# Patient Record
Sex: Female | Born: 1935 | Race: White | Hispanic: No | Smoking: Never smoker
Health system: Southern US, Community
[De-identification: ages and names within clinical notes are randomized; demographics above are authoritative.]

## PROBLEM LIST (undated history)

## (undated) DIAGNOSIS — N189 Chronic kidney disease, unspecified: Secondary | ICD-10-CM

## (undated) DIAGNOSIS — G9341 Metabolic encephalopathy: Secondary | ICD-10-CM

## (undated) DIAGNOSIS — K219 Gastro-esophageal reflux disease without esophagitis: Secondary | ICD-10-CM

## (undated) DIAGNOSIS — I10 Essential (primary) hypertension: Secondary | ICD-10-CM

## (undated) DIAGNOSIS — D509 Iron deficiency anemia, unspecified: Secondary | ICD-10-CM

## (undated) DIAGNOSIS — F32A Depression, unspecified: Secondary | ICD-10-CM

## (undated) DIAGNOSIS — F0390 Unspecified dementia without behavioral disturbance: Secondary | ICD-10-CM

## (undated) HISTORY — PX: ABDOMINAL HYSTERECTOMY: SHX81

---

## 2004-05-08 ENCOUNTER — Ambulatory Visit: Payer: Self-pay | Admitting: Surgery

## 2004-11-27 ENCOUNTER — Ambulatory Visit: Payer: Self-pay | Admitting: Internal Medicine

## 2005-05-16 ENCOUNTER — Emergency Department: Payer: Self-pay | Admitting: Emergency Medicine

## 2005-05-29 ENCOUNTER — Ambulatory Visit: Payer: Self-pay | Admitting: Internal Medicine

## 2005-12-17 ENCOUNTER — Ambulatory Visit: Payer: Self-pay | Admitting: Internal Medicine

## 2006-09-04 ENCOUNTER — Ambulatory Visit: Payer: Self-pay | Admitting: Internal Medicine

## 2006-12-17 ENCOUNTER — Other Ambulatory Visit: Payer: Self-pay

## 2006-12-17 ENCOUNTER — Ambulatory Visit: Payer: Self-pay | Admitting: Ophthalmology

## 2006-12-19 ENCOUNTER — Ambulatory Visit: Payer: Self-pay | Admitting: Internal Medicine

## 2006-12-24 ENCOUNTER — Ambulatory Visit: Payer: Self-pay | Admitting: Ophthalmology

## 2007-02-03 ENCOUNTER — Ambulatory Visit: Payer: Self-pay | Admitting: Ophthalmology

## 2007-12-21 ENCOUNTER — Ambulatory Visit: Payer: Self-pay | Admitting: Internal Medicine

## 2008-12-21 ENCOUNTER — Ambulatory Visit: Payer: Self-pay | Admitting: Internal Medicine

## 2010-01-25 ENCOUNTER — Ambulatory Visit: Payer: Self-pay | Admitting: Internal Medicine

## 2011-01-28 ENCOUNTER — Ambulatory Visit: Payer: Self-pay | Admitting: Internal Medicine

## 2012-01-20 ENCOUNTER — Emergency Department: Payer: Self-pay | Admitting: Emergency Medicine

## 2012-05-15 ENCOUNTER — Ambulatory Visit: Payer: Self-pay | Admitting: Internal Medicine

## 2013-07-22 ENCOUNTER — Inpatient Hospital Stay: Payer: Self-pay | Admitting: Internal Medicine

## 2013-07-22 LAB — COMPREHENSIVE METABOLIC PANEL
ALBUMIN: 3.4 g/dL (ref 3.4–5.0)
ALK PHOS: 178 U/L — AB
AST: 24 U/L (ref 15–37)
Anion Gap: 5 — ABNORMAL LOW (ref 7–16)
BUN: 13 mg/dL (ref 7–18)
Bilirubin,Total: 0.3 mg/dL (ref 0.2–1.0)
CHLORIDE: 98 mmol/L (ref 98–107)
Calcium, Total: 9.2 mg/dL (ref 8.5–10.1)
Co2: 27 mmol/L (ref 21–32)
Creatinine: 0.96 mg/dL (ref 0.60–1.30)
EGFR (Non-African Amer.): 54 — ABNORMAL LOW
GLUCOSE: 145 mg/dL — AB (ref 65–99)
Osmolality: 263 (ref 275–301)
Potassium: 4.2 mmol/L (ref 3.5–5.1)
SGPT (ALT): 23 U/L (ref 12–78)
SODIUM: 130 mmol/L — AB (ref 136–145)
Total Protein: 7.9 g/dL (ref 6.4–8.2)

## 2013-07-22 LAB — URINALYSIS, COMPLETE
Bilirubin,UR: NEGATIVE
Glucose,UR: NEGATIVE mg/dL (ref 0–75)
Ketone: NEGATIVE
LEUKOCYTE ESTERASE: NEGATIVE
NITRITE: NEGATIVE
Ph: 6 (ref 4.5–8.0)
Protein: 30
RBC,UR: <1 /HPF (ref 0–5)
Specific Gravity: 1.003 (ref 1.003–1.030)

## 2013-07-22 LAB — CBC
HCT: 40.2 % (ref 35.0–47.0)
HGB: 12.8 g/dL (ref 12.0–16.0)
MCH: 25.4 pg — ABNORMAL LOW (ref 26.0–34.0)
MCHC: 31.9 g/dL — AB (ref 32.0–36.0)
MCV: 80 fL (ref 80–100)
Platelet: 444 10*3/uL — ABNORMAL HIGH (ref 150–440)
RBC: 5.05 10*6/uL (ref 3.80–5.20)
RDW: 18.6 % — AB (ref 11.5–14.5)
WBC: 10.8 10*3/uL (ref 3.6–11.0)

## 2013-07-22 LAB — PROTIME-INR
INR: 0.9
PROTHROMBIN TIME: 11.6 s (ref 11.5–14.7)

## 2013-07-22 LAB — LIPASE, BLOOD: Lipase: 145 U/L (ref 73–393)

## 2013-07-22 LAB — CLOSTRIDIUM DIFFICILE(ARMC)

## 2013-07-23 LAB — CBC WITH DIFFERENTIAL/PLATELET
Basophil #: 0 10*3/uL (ref 0.0–0.1)
Basophil %: 0.2 %
EOS ABS: 0 10*3/uL (ref 0.0–0.7)
Eosinophil %: 0.5 %
HCT: 32.5 % — ABNORMAL LOW (ref 35.0–47.0)
HGB: 10.6 g/dL — AB (ref 12.0–16.0)
Lymphocyte #: 1.3 10*3/uL (ref 1.0–3.6)
Lymphocyte %: 13.2 %
MCH: 26 pg (ref 26.0–34.0)
MCHC: 32.8 g/dL (ref 32.0–36.0)
MCV: 79 fL — AB (ref 80–100)
MONO ABS: 0.7 x10 3/mm (ref 0.2–0.9)
Monocyte %: 7 %
NEUTROS ABS: 7.7 10*3/uL — AB (ref 1.4–6.5)
NEUTROS PCT: 79.1 %
PLATELETS: 366 10*3/uL (ref 150–440)
RBC: 4.1 10*6/uL (ref 3.80–5.20)
RDW: 18 % — ABNORMAL HIGH (ref 11.5–14.5)
WBC: 9.8 10*3/uL (ref 3.6–11.0)

## 2013-07-23 LAB — BASIC METABOLIC PANEL
ANION GAP: 7 (ref 7–16)
BUN: 8 mg/dL (ref 7–18)
CALCIUM: 8.7 mg/dL (ref 8.5–10.1)
CHLORIDE: 102 mmol/L (ref 98–107)
Co2: 26 mmol/L (ref 21–32)
Creatinine: 0.88 mg/dL (ref 0.60–1.30)
EGFR (Non-African Amer.): 59 — ABNORMAL LOW
Glucose: 99 mg/dL (ref 65–99)
OSMOLALITY: 268 (ref 275–301)
Potassium: 3.8 mmol/L (ref 3.5–5.1)
SODIUM: 135 mmol/L — AB (ref 136–145)

## 2013-07-24 LAB — CBC WITH DIFFERENTIAL/PLATELET
Basophil #: 0.1 10*3/uL (ref 0.0–0.1)
Basophil %: 0.7 %
EOS PCT: 1.2 %
Eosinophil #: 0.1 10*3/uL (ref 0.0–0.7)
HCT: 33.8 % — ABNORMAL LOW (ref 35.0–47.0)
HGB: 10.9 g/dL — ABNORMAL LOW (ref 12.0–16.0)
LYMPHS PCT: 16.5 %
Lymphocyte #: 1.3 10*3/uL (ref 1.0–3.6)
MCH: 25.7 pg — AB (ref 26.0–34.0)
MCHC: 32.3 g/dL (ref 32.0–36.0)
MCV: 80 fL (ref 80–100)
MONOS PCT: 6.8 %
Monocyte #: 0.5 x10 3/mm (ref 0.2–0.9)
Neutrophil #: 5.9 10*3/uL (ref 1.4–6.5)
Neutrophil %: 74.8 %
Platelet: 378 10*3/uL (ref 150–440)
RBC: 4.24 10*6/uL (ref 3.80–5.20)
RDW: 18.7 % — AB (ref 11.5–14.5)
WBC: 7.8 10*3/uL (ref 3.6–11.0)

## 2013-07-27 LAB — STOOL CULTURE

## 2013-11-18 ENCOUNTER — Emergency Department: Payer: Self-pay | Admitting: Emergency Medicine

## 2013-11-18 LAB — COMPREHENSIVE METABOLIC PANEL
ALT: 24 U/L
Albumin: 3.6 g/dL (ref 3.4–5.0)
Alkaline Phosphatase: 132 U/L — ABNORMAL HIGH
Anion Gap: 13 (ref 7–16)
BUN: 24 mg/dL — ABNORMAL HIGH (ref 7–18)
Bilirubin,Total: 0.4 mg/dL (ref 0.2–1.0)
CALCIUM: 9.1 mg/dL (ref 8.5–10.1)
CHLORIDE: 96 mmol/L — AB (ref 98–107)
CO2: 23 mmol/L (ref 21–32)
Creatinine: 1.12 mg/dL (ref 0.60–1.30)
EGFR (Non-African Amer.): 44 — ABNORMAL LOW
GFR CALC AF AMER: 52 — AB
Glucose: 153 mg/dL — ABNORMAL HIGH (ref 65–99)
Osmolality: 272 (ref 275–301)
POTASSIUM: 4.7 mmol/L (ref 3.5–5.1)
SGOT(AST): 24 U/L (ref 15–37)
Sodium: 132 mmol/L — ABNORMAL LOW (ref 136–145)
TOTAL PROTEIN: 8 g/dL (ref 6.4–8.2)

## 2013-11-18 LAB — CBC WITH DIFFERENTIAL/PLATELET
BASOS PCT: 0.2 %
Basophil #: 0 10*3/uL (ref 0.0–0.1)
Eosinophil #: 0 10*3/uL (ref 0.0–0.7)
Eosinophil %: 0.2 %
HCT: 38.7 % (ref 35.0–47.0)
HGB: 12.3 g/dL (ref 12.0–16.0)
LYMPHS ABS: 1.7 10*3/uL (ref 1.0–3.6)
Lymphocyte %: 9.1 %
MCH: 27.4 pg (ref 26.0–34.0)
MCHC: 31.7 g/dL — ABNORMAL LOW (ref 32.0–36.0)
MCV: 87 fL (ref 80–100)
MONO ABS: 0.9 x10 3/mm (ref 0.2–0.9)
MONOS PCT: 4.9 %
NEUTROS ABS: 15.7 10*3/uL — AB (ref 1.4–6.5)
Neutrophil %: 85.6 %
PLATELETS: 489 10*3/uL — AB (ref 150–440)
RBC: 4.48 10*6/uL (ref 3.80–5.20)
RDW: 15.7 % — AB (ref 11.5–14.5)
WBC: 18.4 10*3/uL — AB (ref 3.6–11.0)

## 2013-11-18 LAB — LIPASE, BLOOD: Lipase: 199 U/L (ref 73–393)

## 2013-11-19 LAB — URINALYSIS, COMPLETE
BILIRUBIN, UR: NEGATIVE
Bacteria: NONE SEEN
Blood: NEGATIVE
GLUCOSE, UR: NEGATIVE mg/dL (ref 0–75)
Ketone: NEGATIVE
NITRITE: NEGATIVE
PH: 6 (ref 4.5–8.0)
RBC,UR: 2 /HPF (ref 0–5)
Specific Gravity: 1.008 (ref 1.003–1.030)
WBC UR: 11 /HPF (ref 0–5)

## 2014-07-30 NOTE — Discharge Summary (Signed)
PATIENT NAME:  Suzanne Beltran, [name] MR#:  [id number] DATE OF BIRTH:  April 10, 1924  DATE OF ADMISSION:  07/22/2013 DATE OF DISCHARGE:  07/24/2013  PRIMARY CARE PHYSICIAN:  Dr. Larwance SachsBabaoff.  FINAL DIAGNOSES: 1.  Bloody diarrhea, acute colitis, ischemic versus infectious.  2.  Anemia.  3.  Gastroesophageal reflux disease.  4.  Hyponatremia.  5.  Hypertension.   MEDICATIONS ON DISCHARGE:  Include atenolol 25 mg daily, lisinopril 20 mg 2 tablets daily, Zoloft 50 mg 1/2 tablet twice a day, amlodipine 10 mg daily, clorazepate 3.75 mg at bedtime as needed, Prilosec 20 mg daily, metronidazole 500 mg every eight hours for seven days, hydrocortisone 25 mg one suppository rectally twice a day as needed for hemorrhoids.  Cipro 500 mg 1 tablet every 12 hours for seven days.   DIET:  Bland diet.   ACTIVITY:  As tolerated.   DIET CONSISTENCY:  Regular.   REFERRAL:  To Dr. Mechele CollinElliott as needed, 1 to 2 weeks with Dr. Larwance SachsBabaoff.   HOSPITAL COURSE:  The patient was admitted 07/22/2013 and discharged 07/24/2013, came in with abdominal pain and diarrhea, believed to be acute colitis.  Stool studies were sent off.  The patient was started on empiric Cipro and Flagyl.  The patient was seen in consultation by Aura Dialsandace Jones,  physician's assistant.   LABORATORY AND RADIOLOGICAL DATA DURING THE HOSPITAL COURSE:  Included an EKG that showed sinus rhythm, premature atrial complexes.  Urinalysis 1+ blood.  INR 0.9.  Glucose 145, BUN 13, creatinine is 0.96, sodium 130, potassium 4.2, chloride 98, CO2 27, calcium 9.2, alkaline phosphatase slightly elevated at 178, ALT 23, AST 24, total protein 7.9.  White blood cell count 10.8, hemoglobin and hematocrit 12.8 and 40.2, platelet count of 444.  CT scan of the abdomen and pelvis showed findings consistent with inflammatory infectious colitis of the distal transverse and proximal descending colon.  No abscess or abnormal fluid collection is noted.  Large hiatal hernia, increased in size from  prior exam.  Stool for C. diff. was negative.  Stool culture:  No E. coli or Campylobacter.  Sodium upon discharge 135.  White blood cell count 7.8, hemoglobin 10.9, platelet count normal range.   HOSPITAL COURSE PER PROBLEM LIST:  1.  For the patient's bloody diarrhea, acute colitis, ischemic versus infectious.  The patient was feeling better.  Still did have some bowel movements, but forming up.  Some discomfort in the abdomen, but the patient was feeling much better than when she came in.  The patient was switched over to oral antibiotics and we will finish up a course, nothing grew on the stool studies.  2.  Anemia.  Hemoglobin stable.  3.  Gastroesophageal reflux disease.  Also found to have a hiatal hernia.  Continue Prilosec.  4.  Hyponatremia, improved with IV fluids.  5.  Hypertension.  Blood pressure stable on usual medications.  Time spent on discharge 35 minutes.    ____________________________ Herschell Dimesichard J. Renae GlossWieting, MD rjw:ea D: 07/24/2013 15:20:27 ET T: 07/25/2013 02:03:59 ET JOB#: [id number]  cc: Herschell Dimesichard J. Renae GlossWieting, MD, <Dictator> Kandyce RudMarcus Babaoff, MD Scot Junobert T. Elliott, MD  Salley ScarletICHARD J Madisyn Mawhinney MD ELECTRONICALLY SIGNED 07/25/2013 14:17

## 2014-07-30 NOTE — H&P (Signed)
PATIENT NAME:  DEA, BITTING MR#:  [id number] DATE OF BIRTH:  1924-09-02  DATE OF ADMISSION:  07/22/2013  PRIMARY CARE PROVIDER: Dr. Larwance Sachs.  EMERGENCY DEPARTMENT REFERRING DOCTOR: Dr. Margarita Grizzle.   CHIEF COMPLAINT: Abdominal pain, diarrhea.   HISTORY OF PRESENT ILLNESS: The patient is a pleasant 81 year old white female a previous history of hyperlipidemia, not on any medications, now, hypertension, GERD, depression, anxiety, history of diverticulosis, who states that she was in good health up until yesterday when she all of a sudden developed left lower quadrant. It was sharp in nature. Now it has become dull. She also started having diarrhea, which is described as a bloody diarrhea. Due to these symptoms, she comes to the ED. She reports that she has not had any sick contacts. Has not been on any antibiotics and has been feeling well. She denies any fevers, chills. No chest pain, shortness of breath. She does have chronic urinary urgency. She denies any episodes of low blood pressure that she can recall.   PAST MEDICAL HISTORY:  1. History of hyperlipidemia and was on medications now taken off.  2. Hypertension.  3. Gastroesophageal reflux disease.  4. Depression and anxiety.  5. History of diverticulosis.   PAST SURGICAL HISTORY: Status post hysterectomy.   ALLERGIES: SULFA.   CURRENT MEDICATIONS AT HOME: Amlodipine 10 daily, Atenolol 25 daily, clorazepate 3.75 mg 1 tab p.o. at bedtime, lisinopril 20,  2 tabs daily, Prilosec 20 daily, sertraline 25 mg 1 tab p.o. b.i.d.   SOCIAL HISTORY: Does not smoke. Does not drink. No drugs. Lives with her husband.   FAMILY HISTORY: There is a history of diverticulitis in a grandfather.   REVIEW OF SYSTEMS:  CONSTITUTIONAL: Denies any fevers, fatigue, weakness. Complains of abdominal pain. No weight loss or weight gain.  EYES: No blurred or double vision. No pain, redness or inflammation. No glaucoma. History of cataracts, status post surgery.   ENT: No tinnitus. No ear pain. No hearing loss. No seasonal or year-round allergies. No epistaxis. No nasal discharge. No difficulty swallowing.  RESPIRATORY: Denies any cough, wheezing, hemoptysis. No COPD, no tuberculosis.  CARDIOVASCULAR: Denies any chest pain, orthopnea, edema or arrhythmia.  GASTROINTESTINAL: No nausea, vomiting. Complains of diarrhea complains of abdominal pain. No hematemesis. No melena. No ulcer. (Dictation Anomaly) . No irritable bowel syndrome. No jaundice. Does complain of bloody stools.  GENITOURINARY: Denies any dysuria, hematuria, renal calculus. Does complain of urinary urgency. ENDOCRINE: Denies any polyuria, nocturia or thyroid problems.  HEMATOLOGIC AND LYMPHATIC: Denies anemia, easy bruisability or bleeding.  SKIN: No acne. No rash. No changes in hair, mole or skin.  MUSCULOSKELETAL: No pain in the neck, back or shoulder.  NEUROLOGIC: No numbness, CVA, transient ischemic attack or seizures.  PSYCHIATRIC: Does have anxiety and depression.   PHYSICAL EXAMINATION: VITAL SIGNS: Temperature 98.1, pulse 93, respirations 18, blood pressure 125/72, O2 of 98%.  GENERAL: The patient is a well-developed, well-nourished female in no acute distress.  HEENT: Head atraumatic, normocephalic. Pupils equally round, reactive to light and accommodation. There is no conjunctival pallor. No scleral icterus. Nasal exam shows no drainage or ulceration.  OROPHARYNX: Clear without any exudate.  NECK: Supple without any JVD.  CARDIOVASCULAR: Regular rate and rhythm. No murmurs, rubs, clicks, or gallops.  LUNGS: Clear to auscultation bilaterally without any rales, rhonchi, wheezing.  ABDOMEN: Soft, nontender, nondistended. Positive bowel sounds x 4.  EXTREMITIES: No clubbing, cyanosis, or edema.  SKIN: No rash.  LYMPHATICS: No lymph nodes palpable.  VASCULAR: Good DP,  PT pulses.   PSYCHIATRIC: Not anxious or depressed. NEUROLOGICAL: Alert, oriented x 3. No focal deficits.   LYMPHATICS: No lymph nodes palpable.   LABORATORY DATA: Glucose 145, BUN 13, creatinine 0.96, sodium 130, potassium 4.2, chloride 98, CO2 is 27, calcium 9.2, lipase 145. LFTs were normal, except alkaline phosphatase of 178. WBC 10.8, hemoglobin 12.8, platelet count 444, INR 0.9. Urinalysis is negative. CT scan of the abdomen with contrast shows large sliding hiatal hernia. Stable bilateral renal cyst. Findings consistent with inflammatory infectious colitis of the distal transverse and proximal descending colon.   ASSESSMENT AND PLAN: The patient is an 81 year old white female with hypertension gastroesophageal reflux disease, depression, anxiety, who presents with abdominal pain, diarrhea.  1. Diarrhea. Abdominal pain due to acute colitis: Possible differential diagnosis could include ischemic infectious colitis. At this time, we will give her IV fluids. We will check  comprehensive stool studies. Rule out C. difficile. She will be on IV antibiotics with Cipro and p.o. Flagyl. I will ask gastroenterology to evaluate the patient. We will  follow her hemoglobin and transfuse as needed.  2. Hypertension. We will continue atenolol, amlodipine and lisinopril.   3. Gastroesophageal reflux disease. She was on Prilosec. We will place her on Protonix.  4. Hyponatremia likely due to dehydration. We will give her IV fluids.  5. Miscellaneous. She will be on sequential compression devices for deep vein thrombosis prophylaxis.  TIME SPENT ON THIS PATIENT: Is 50 minutes.    ____________________________ Lacie ScottsShreyang H. Allena KatzPatel, MD shp:sg D: 07/22/2013 13:03:17 ET T: 07/22/2013 13:15:58 ET JOB#: [id number]  cc: Kayleah Appleyard H. Allena KatzPatel, MD, <Dictator> Charise CarwinSHREYANG H Shawnita Krizek MD ELECTRONICALLY SIGNED 07/23/2013 13:48

## 2014-07-30 NOTE — Consult Note (Signed)
PATIENT NAME:  Suzanne Beltran, Suzanne Beltran MR#:  [id number] DATE OF BIRTH:  09-30-1924  DATE OF CONSULTATION:  07/23/2013  REFERRING PHYSICIAN:  Dr. Posey Pronto CONSULTING PHYSICIAN:  Lucilla Lame, MD / Andria Meuse, NP  PRIMARY CARE PHYSICIAN: Dr. Baldemar Lenis  REASON FOR CONSULTATION: Acute colitis.   HISTORY OF PRESENT ILLNESS: Ms. Scinto is a very pleasant 81 year old Caucasian female who developed left lower quadrant abdominal pain 2 days ago. She tells me she had just finished vacuuming and doing the wash and she sat down to eat some french toast around 10:00 in the morning. She describes the pain as a hard pain that radiated to her back. She does have a history of low back pain and left hip pain. She did have a little diarrhea that night and awakened with a bowel movement and small amount of blood when she wiped with the tissue the next morning. She has a history hemorrhoids and so she does notice intermittent hematochezia with wiping at times, especially when she has constipation. Episode yesterday morning seemed to be a little more volume of bleeding. She takes stool softeners as needed. She denies any nausea, vomiting, fever or chills. Her abdominal pain has resolved today. Her weight has been stable over the last 6 months. She has had some anorexia with some stress in her life, but overall has been doing well. She had a C. diff which was negative. Her hemoglobin dropped from 12.8 to 10.6. She is not taking any NSAIDs. She had a colonoscopy October of 2004 by Dr. Vira Agar, which showed inflammation of the distal transverse and proximal descending colon. She also had mild sigmoid diverticulosis without inflammation. CT also showed stable bilateral renal cysts and a large hiatal hernia that has increased in size since last imaging. She denies any NSAIDs.   PAST MEDICAL AND SURGICAL HISTORY: She has an essential tremor, hyperlipidemia, GERD, diverticulosis, history of adenomatous polyps with last colonoscopy in  2004, cataracts, diverticulitis many years ago, hysterectomy followed by bilateral salpingo-oophorectomy.  MEDICATIONS PRIOR TO ADMISSION: Amlodipine 10 mg daily, atenolol 25 mg daily, clorazepate 3.75 mg daily, lisinopril 20 mg 2 tablets daily, Prilosec 20 mg daily, sertraline 50 mg 1/2 tablet b.i.d., Tylenol p.r.n.    ALLERGIES: SULFA CAUSES HIVES.  FAMILY HISTORY: Positive for father with diverticulitis. She had 1 daughter with breast cancer. There is no known family history of colorectal carcinoma or liver problems.   SOCIAL HISTORY: She lives with her husband. She has 2 grown healthy children. She denies any tobacco, alcohol or illicit drug use. She is retired from a [employer].   REVIEW OF SYSTEMS: Musculoskeletal: She has chronic left hip and left low back pain for which she takes Tylenol. Otherwise negative 12 point review of systems.  PHYSICAL EXAMINATION: VITAL SIGNS: Temp 98.4, pulse 67, respirations 18, blood pressure 137/66, O2 sat 95% on room air.  GENERAL: She is a well-developed, well-nourished elderly Caucasian female in no acute distress.  HEENT: Sclerae clear, anicteric. Conjunctivae pink. Oropharynx pink and moist without any lesions.  NECK: Supple without mass or thyromegaly.  CHEST: Heart regular rate and rhythm. Normal S1, S2. No murmurs, clicks, rubs, or gallops.  LUNGS: Clear to auscultation bilaterally.  ABDOMEN: Positive bowel sounds x4. No bruits auscultated. She has mild left lower quadrant tenderness on deep palpation. There is no rebound tenderness or guarding. No hepatosplenomegaly or mass.  RECTAL: Deferred.  EXTREMITIES: Without clubbing or edema.  SKIN: Pink, warm and dry without rash or jaundice.  NEUROLOGIC: Grossly  intact.  MUSCULOSKELETAL: Good equal movement and strength bilaterally. She does have a right arm essential tremor.  PSYCHIATRIC: Normal mood and affect. Alert and cooperative.   LABORATORY STUDIES: Lipase 145. White blood cell count 9.8,  platelets 366,000. INR 0.9. Sodium 135. Otherwise normal MET-7.   IMPRESSION: Ms. Franze is a very pleasant 81 year old Caucasian female with acute colitis and hematochezia. Her last colonoscopy was 11 years ago, but she does not want to pursue endoscopic work-up for this given her age and she understands that inflammatory bowel disease or malignancy may be missed; however, I feel this is reasonable as her symptoms are suggestive of acute infectious colitis, ischemic colitis and cannot rule out diverticulitis either. Her Clostridium difficile was negative.   PLAN: 1.  Cipro and Flagyl was changed to p.o. and she should complete a 10 day course to cover for diverticulitis or any infectious diarrhea. 2.  Follow up on her stool culture.  3.  Anusol suppositories for her known hemorrhoids, 25 mg b.i.d.  4.  Continue supportive measures.   Thank you for allowing [place] to participate in her care.  This services provided by Andria Meuse, NP under collaborative agreement with Dr. Lucilla Lame. ____________________________ Andria Meuse, NP klj:sb D: 07/23/2013 15:56:12 ET T: 07/23/2013 16:28:34 ET JOB#: [id number]  cc: Andria Meuse, NP, <Dictator> Derinda Late, MD Andria Meuse FNP ELECTRONICALLY SIGNED 07/27/2013 16:34

## 2014-07-30 NOTE — Consult Note (Signed)
Brief Consult Note: Diagnosis: Acute colitis.   Patient was seen by consultant.   Comments: Ms. Suzanne Beltran is a very pleasant 81 y/o caucasian female with acute colitis & hematochezia.  Last colonoscopy 11 years ago but patient does not want to pursue endoscopic work-up for this given her age & she understands that inflammatory bowel disease or malignancy may be missed however I feel this is reasonable as her symptoms are suggestive of acute infectious colitis, ischemia or diverticulitis.  C diff negative.    Plan: 1) Cipro & flagyl changed to PO & she should complete 10day course 2) follow up stool culture 3) Anusol supp for known hemorrhoids BID 4) continue supportive measures Thanks for allowing us to participate in her care.  Please see full dictated note. #[id number] Dr Marva PandaSkulskie will follow over the weekend..  Electronic Signatures: Joselyn ArrowJones, Wanna Gully L (NP)  (Signed 17-Apr-15 15:57)  Authored: Brief Consult Note   Last Updated: 17-Apr-15 15:57 by Joselyn ArrowJones, Baileigh Modisette L (NP)

## 2017-10-07 ENCOUNTER — Emergency Department: Payer: Medicare Other

## 2017-10-07 ENCOUNTER — Encounter: Payer: Self-pay | Admitting: Emergency Medicine

## 2017-10-07 ENCOUNTER — Emergency Department
Admission: EM | Admit: 2017-10-07 | Discharge: 2017-10-07 | Disposition: A | Discharge status: Home / Self Care | Payer: Medicare Other | Attending: Emergency Medicine | Admitting: Emergency Medicine

## 2017-10-07 ENCOUNTER — Other Ambulatory Visit: Payer: Self-pay

## 2017-10-07 DIAGNOSIS — I10 Essential (primary) hypertension: Secondary | ICD-10-CM | POA: Insufficient documentation

## 2017-10-07 DIAGNOSIS — R109 Unspecified abdominal pain: Secondary | ICD-10-CM | POA: Diagnosis present

## 2017-10-07 HISTORY — DX: Essential (primary) hypertension: I10

## 2017-10-07 LAB — CBC
HEMATOCRIT: 40.6 % (ref 35.0–47.0)
Hemoglobin: 13.9 g/dL (ref 12.0–16.0)
MCH: 30.6 pg (ref 26.0–34.0)
MCHC: 34.2 g/dL (ref 32.0–36.0)
MCV: 89.5 fL (ref 80.0–100.0)
Platelets: 460 10*3/uL — ABNORMAL HIGH (ref 150–440)
RBC: 4.53 MIL/uL (ref 3.80–5.20)
RDW: 14.1 % (ref 11.5–14.5)
WBC: 8 10*3/uL (ref 3.6–11.0)

## 2017-10-07 LAB — LIPASE, BLOOD: LIPASE: 54 U/L — AB (ref 11–51)

## 2017-10-07 LAB — URINALYSIS, COMPLETE (UACMP) WITH MICROSCOPIC
BACTERIA UA: NONE SEEN
BILIRUBIN URINE: NEGATIVE
GLUCOSE, UA: NEGATIVE mg/dL
Hgb urine dipstick: NEGATIVE
KETONES UR: NEGATIVE mg/dL
Leukocytes, UA: NEGATIVE
NITRITE: NEGATIVE
PH: 6 (ref 5.0–8.0)
PROTEIN: 100 mg/dL — AB
Specific Gravity, Urine: 1.008 (ref 1.005–1.030)

## 2017-10-07 LAB — COMPREHENSIVE METABOLIC PANEL
ALT: 17 U/L (ref 0–44)
ANION GAP: 12 (ref 5–15)
AST: 34 U/L (ref 15–41)
Albumin: 4.9 g/dL (ref 3.5–5.0)
Alkaline Phosphatase: 119 U/L (ref 38–126)
BUN: 20 mg/dL (ref 8–23)
CHLORIDE: 92 mmol/L — AB (ref 98–111)
CO2: 22 mmol/L (ref 22–32)
Calcium: 9.5 mg/dL (ref 8.9–10.3)
Creatinine, Ser: 0.97 mg/dL (ref 0.44–1.00)
GFR calc Af Amer: 58 mL/min — ABNORMAL LOW (ref >60)
GFR calc non Af Amer: 50 mL/min — ABNORMAL LOW (ref >60)
GLUCOSE: 114 mg/dL — AB (ref 70–99)
POTASSIUM: 4.1 mmol/L (ref 3.5–5.1)
SODIUM: 126 mmol/L — AB (ref 135–145)
Total Bilirubin: 0.7 mg/dL (ref 0.3–1.2)
Total Protein: 9.1 g/dL — ABNORMAL HIGH (ref 6.5–8.1)

## 2017-10-07 NOTE — ED Triage Notes (Signed)
Pt to ED from home c/o left lower back pain radiating to left hip and left abd pain.  States moved furniture 2-3 weeks ago and pain has been since then.  States burning with urination.  Nausea with vomiting, denies diarrhea or fevers.

## 2017-10-07 NOTE — Discharge Instructions (Addendum)
As we discussed your work-up today has shown largely normal results besides a low sodium level.  Please increase your salt intake over the next several weeks.  Please follow-up with your doctor for recheck of your salt levels in the next 3 to 4 days.  Return to the emergency department for any worsening pain, development of fever, or any other symptom personally concerning to yourself.

## 2017-10-07 NOTE — ED Notes (Signed)
Patient transported to CT 

## 2017-10-07 NOTE — ED Provider Notes (Signed)
[facility] Emergency Department Provider Note  Time seen: 5:46 PM  I have reviewed the triage vital signs and the nursing notes.   HISTORY  Chief Complaint Abdominal Pain and Back Pain    HPI Suzanne Beltran is a 84 y.o. female with a past medical history of hypertension who presents to the emergency department for left flank pain.  According to the patient for the past 1 week she is intermittently experiencing left flank pain.  States she has been moving furniture, is not sure if this is musculoskeletal.  She states 1 month ago she thought she had a urinary tract infection but was checked by her primary care doctor and told that she did not.  Denies any fever, nausea, vomiting or diarrhea.  Describes the pain as mild, denies any worsening with movement.   Past Medical History:  Diagnosis Date  . Hypertension     There are no active problems to display for this patient.   Past Surgical History:  Procedure Laterality Date  . ABDOMINAL HYSTERECTOMY      Prior to Admission medications   Not on File    No Known Allergies  History reviewed. No pertinent family history.  Social History Social History   Tobacco Use  . Smoking status: Never Smoker  . Smokeless tobacco: Never Used  Substance Use Topics  . Alcohol use: Never    Frequency: Never  . Drug use: Never    Review of Systems Constitutional: Negative for fever. Eyes: Negative for visual complaints ENT: Negative for recent illness/congestion Cardiovascular: Negative for chest pain. Respiratory: Negative for shortness of breath. Gastrointestinal: Mild left lower quadrant abdominal pain and left back pain.  Negative for nausea vomiting diarrhea Genitourinary: Negative for urinary compaints Musculoskeletal: Negative for musculoskeletal complaints Skin: Negative for skin complaints  Neurological: Negative for headache All other ROS  negative  ____________________________________________   PHYSICAL EXAM:  VITAL SIGNS: ED Triage Vitals  Enc Vitals Group     BP 10/07/17 1656 (!) 205/80     Pulse Rate 10/07/17 1656 74     Resp 10/07/17 1656 20     Temp 10/07/17 1656 98.1 F (36.7 C)     Temp Source 10/07/17 1656 Oral     SpO2 10/07/17 1656 98 %     Weight 10/07/17 1656 106 lb (48.1 kg)     Height 10/07/17 1656 4\' 11"  (1.499 m)     Head Circumference --      Peak Flow --      Pain Score 10/07/17 1704 10     Pain Loc --      Pain Edu? --      Excl. in GC? --     Constitutional: Alert and oriented. Well appearing and in no distress. Eyes: Normal exam ENT   Head: Normocephalic and atraumatic.   Mouth/Throat: Mucous membranes are moist. Cardiovascular: Normal rate, regular rhythm.  Respiratory: Normal respiratory effort without tachypnea nor retractions. Breath sounds are clear  Gastrointestinal: Soft, slight left lower quadrant tenderness, mild left paraspinal tenderness to palpation, no CVA tenderness.  No abdominal distention, rebound or guarding. Musculoskeletal: Nontender with normal range of motion in all extremities. Neurologic:  Normal speech and language. No gross focal neurologic deficits Skin:  Skin is warm, dry and intact.  Psychiatric: Mood and affect are normal.   ____________________________________________   RADIOLOGY  CT negative for acute abnormality.  ____________________________________________   INITIAL IMPRESSION / ASSESSMENT AND PLAN / ED COURSE  Pertinent labs &  imaging results that were available during my care of the patient were reviewed by me and considered in my medical decision making (see chart for details).  She presents emergency department for left flank pain intermittent over the past 1 week.  Differential would include ureterolithiasis, pyelonephritis, urinary tract infection, muscular skeletal pain, diverticulitis or colitis.  Patient's labs show  hyponatremia with a sodium of 126.  In reviewing the patient's records she has hyponatremia at baseline but usually between 130-133, slightly lower today.  Urinalysis is negative.  Mild lipase elevation but no epigastric tenderness.  We will obtain a CT scan of the abdomen to further evaluate.  Patient agreeable to plan of care.  CT scans negative for acute abnormality.  Labs are largely within normal limits.  I discussed these results with the patient and family, they are reassured, we will discharge the patient home with instructions to increase her sodium intake and follow-up with her doctor later this week for recheck of her sodium level.  Patient agreeable to this plan of care. ____________________________________________   FINAL CLINICAL IMPRESSION(S) / ED DIAGNOSES  Flank pain    Minna Antis, MD 10/07/17 1907

## 2020-08-23 ENCOUNTER — Emergency Department: Payer: Medicare Other

## 2020-08-23 ENCOUNTER — Emergency Department
Admission: EM | Admit: 2020-08-23 | Discharge: 2020-08-23 | Disposition: A | Discharge status: Home / Self Care | Payer: Medicare Other | Attending: Emergency Medicine | Admitting: Emergency Medicine

## 2020-08-23 ENCOUNTER — Other Ambulatory Visit: Payer: Self-pay

## 2020-08-23 DIAGNOSIS — S79912A Unspecified injury of left hip, initial encounter: Secondary | ICD-10-CM | POA: Diagnosis present

## 2020-08-23 DIAGNOSIS — S72115A Nondisplaced fracture of greater trochanter of left femur, initial encounter for closed fracture: Secondary | ICD-10-CM | POA: Diagnosis not present

## 2020-08-23 DIAGNOSIS — W01198A Fall on same level from slipping, tripping and stumbling with subsequent striking against other object, initial encounter: Secondary | ICD-10-CM | POA: Insufficient documentation

## 2020-08-23 DIAGNOSIS — I10 Essential (primary) hypertension: Secondary | ICD-10-CM | POA: Insufficient documentation

## 2020-08-23 DIAGNOSIS — Z79899 Other long term (current) drug therapy: Secondary | ICD-10-CM | POA: Insufficient documentation

## 2020-08-23 NOTE — ED Notes (Signed)
Proivded DC instructions. Verbalized understanding.

## 2020-08-23 NOTE — Discharge Instructions (Signed)
Use your walker any time you are up and moving around.  Schedule an appointment with orthopedics for repeat imaging.  Rest, ice off and on throughout the day, take Tylenol every 8 hours.  Return to the ER for symptoms that change or worsen if unable to see primary care or orthopedics.

## 2020-08-23 NOTE — ED Triage Notes (Signed)
Pt comes with c/o trip and fall outside today. Pt states she tripped over her outside furniture. Pt denies any LOC. Pt states pain to left hip area. Pt states she can ambulate on it but it is painful.

## 2020-08-23 NOTE — ED Provider Notes (Signed)
[facility] Emergency Department Provider Note ____________________________________________  Time seen: Approximately 3:50 PM  I have reviewed the triage vital signs and the nursing notes.   HISTORY  Chief Complaint Fall    HPI Suzanne Beltran is a 87 y.o. female who presents to the emergency department for evaluation and treatment of left hip pain. She states that her son-in-law brought her some lunch and she tripped on one of her patio chairs and fell onto her hip. She did not hit her head. She is not on blood thinners. No other injury. Pain in hip increases with knee flexion and ambulation. No alleviating measures prior to arrival.  Past Medical History:  Diagnosis Date  . Hypertension     There are no problems to display for this patient.   Past Surgical History:  Procedure Laterality Date  . ABDOMINAL HYSTERECTOMY      Prior to Admission medications   Medication Sig Start Date End Date Taking? Authorizing Provider  acetaminophen (TYLENOL) 500 MG tablet Take 500 mg by mouth as needed for mild pain or moderate pain.     [provider]  amLODipine (NORVASC) 10 MG tablet Take 10 mg by mouth daily.    [provider]  atenolol (TENORMIN) 25 MG tablet Take 25 mg by mouth daily.    [provider]  cetirizine (ZYRTEC) 10 MG tablet Take 10 mg by mouth daily as needed for allergies.     [provider]  ferrous sulfate 325 (65 FE) MG tablet Take 325 mg by mouth daily with breakfast.    [provider]  lisinopril (PRINIVIL,ZESTRIL) 40 MG tablet Take 40 mg by mouth daily.    [provider]  meloxicam (MOBIC) 7.5 MG tablet Take 3.75 mg by mouth daily.    [provider]  mirtazapine (REMERON) 15 MG tablet Take 7.5 mg by mouth at bedtime.    [provider]  Multiple Vitamins-Minerals (ICAPS PO) Take 1 capsule by mouth daily.    [provider]  omeprazole (PRILOSEC) 20 MG  capsule Take 20 mg by mouth daily as needed (heartburn).     [provider]  sertraline (ZOLOFT) 50 MG tablet Take 25 mg by mouth daily.    [provider]    Allergies Patient has no known allergies.  No family history on file.  Social History Social History   Tobacco Use  . Smoking status: Never Smoker  . Smokeless tobacco: Never Used  Substance Use Topics  . Alcohol use: Never  . Drug use: Never    Review of Systems Constitutional: Negative for fever. Cardiovascular: Negative for chest pain. Respiratory: Negative for shortness of breath. Musculoskeletal: Positive for left hip pain. Skin: Negative for open wound.  Neurological: Negative for decrease in sensation  ____________________________________________   PHYSICAL EXAM:  VITAL SIGNS: ED Triage Vitals  Enc Vitals Group     BP 08/23/20 1400 (!) 174/88     Pulse Rate 08/23/20 1400 90     Resp 08/23/20 1400 20     Temp 08/23/20 1400 98.7 F (37.1 C)     Temp Source 08/23/20 1400 Oral     SpO2 08/23/20 1400 98 %     Weight 08/23/20 1400 92 lb (41.7 kg)     Height 08/23/20 1400 5' (1.524 m)     Head Circumference --      Peak Flow --      Pain Score 08/23/20 1405 10     Pain  Loc --      Pain Edu? --      Excl. in GC? --     Constitutional: Alert and oriented. Well appearing and in no acute distress. Eyes: Conjunctivae are clear without discharge or drainage Head: Atraumatic Neck: Supple. No focal midline tenderness. Respiratory: No cough. Respirations are even and unlabored. Musculoskeletal: lateral hip pain without leg shortening or rotation. Neurologic: Awake, alert, oriented x 4.   Skin: No open wounds overlying the left hip.  Psychiatric: Affect and behavior are appropriate.  ____________________________________________   LABS (all labs ordered are listed, but only abnormal results are displayed)  Labs Reviewed - No data to  display ____________________________________________  RADIOLOGY  X-ray of the left hip negative for acute concerns.  I, Kem Boroughs, personally viewed and evaluated these images (plain radiographs) as part of my medical decision making, as well as reviewing the written report by the radiologist.  CT Hip Left Wo Contrast  Result Date: 08/23/2020 CLINICAL DATA:  Hip pain after tripping and falling. EXAM: CT OF THE LEFT HIP WITHOUT CONTRAST TECHNIQUE: Multidetector CT imaging of the left hip was performed according to the standard protocol. Multiplanar CT image reconstructions were also generated. COMPARISON:  Radiographs same date.  Pelvic CT 10/07/2017. FINDINGS: Bones/Joint/Cartilage There is an acute mildly displaced fracture of the left greater trochanter. This fracture demonstrates no definite intertrochanteric extension. There is underlying advanced left hip osteoarthritis with joint space narrowing, osteophytes and subchondral cyst formation in the femoral head and acetabulum. No significant hip joint effusion. There are old pubic rami fractures bilaterally. Ligaments Suboptimally assessed by CT. Muscles and Tendons Unremarkable. Soft tissues Mild soft tissue swelling around the greater trochanter without focal hematoma. No foreign body or soft tissue emphysema. Mild iliofemoral atherosclerosis. Diverticular changes are noted within the visualized sigmoid colon. IMPRESSION: 1. Acute mildly displaced fracture of the left femoral greater trochanter. This fracture demonstrates no definite intertrochanteric extension. Subtle intertrochanteric extension can be occult by CT, and if the patient has inability to bear weight, radiographic or MR follow-up should be considered. 2. Old pubic rami fractures bilaterally. 3. Moderately advanced left hip osteoarthritis. Electronically Signed   By: Carey Bullocks M.D.   On: 08/23/2020 16:16   DG Hip Unilat With Pelvis 2-3 Views Left  Result Date:  08/23/2020 CLINICAL DATA:  Acute left hip pain after fall 3 days ago. EXAM: DG HIP (WITH OR WITHOUT PELVIS) 2-3V LEFT COMPARISON:  None. FINDINGS: There is no evidence of hip fracture or dislocation. Moderate osteophyte formation and narrowing of the left hip joint is noted. IMPRESSION: Moderate osteoarthritis of the left hip.  No acute abnormality seen. Electronically Signed   By: Lupita Raider M.D.   On: 08/23/2020 14:54   ____________________________________________   PROCEDURES  Procedures  ____________________________________________   INITIAL IMPRESSION / ASSESSMENT AND PLAN / ED COURSE  Suzanne Beltran is a 87 y.o. who presents to the emergency department for treatment and evaluation of left hip pain. See HPI.  She states that she is still able to bear weight and walk unassisted, however has pain in her hip.  X-ray is negative. Pain increases with flexion while non-weightbearing. Plan will be to do CT to ensure absence of fracture. Patient aware and agreeable with the plan.  CT shows acute mildly displaced fracture of the left femoral greater trochanter with out intertrochanteric extension.  Patient will be discharged home to follow-up with orthopedics.  She was strongly encouraged to use a walker anytime she is  out of bed.  Medications - No data to display  Pertinent labs & imaging results that were available during my care of the patient were reviewed by me and considered in my medical decision making (see chart for details).   _________________________________________   FINAL CLINICAL IMPRESSION(S) / ED DIAGNOSES  Final diagnoses:  Closed nondisplaced fracture of greater trochanter of left femur, initial encounter [facility])    ED Discharge Orders    None       If controlled substance prescribed during this visit, 12 month history viewed on the [facility] prior to issuing an initial prescription for Schedule II or III opiod.   Chinita Pester, FNP 08/23/20 2120     Shaune Pollack, MD 08/29/20 2056

## 2021-05-19 ENCOUNTER — Emergency Department: Payer: Medicare Other

## 2021-05-19 ENCOUNTER — Inpatient Hospital Stay
Admission: EM | Admit: 2021-05-19 | Discharge: 2021-05-25 | DRG: 305 | Disposition: A | Discharge status: Skilled Nursing Facility | Payer: Medicare Other | Attending: Student in an Organized Health Care Education/Training Program | Admitting: Student in an Organized Health Care Education/Training Program

## 2021-05-19 ENCOUNTER — Other Ambulatory Visit: Payer: Self-pay

## 2021-05-19 DIAGNOSIS — F03918 Unspecified dementia, unspecified severity, with other behavioral disturbance: Secondary | ICD-10-CM | POA: Diagnosis present

## 2021-05-19 DIAGNOSIS — R4182 Altered mental status, unspecified: Secondary | ICD-10-CM | POA: Diagnosis not present

## 2021-05-19 DIAGNOSIS — F32A Depression, unspecified: Secondary | ICD-10-CM | POA: Diagnosis present

## 2021-05-19 DIAGNOSIS — D509 Iron deficiency anemia, unspecified: Secondary | ICD-10-CM | POA: Diagnosis present

## 2021-05-19 DIAGNOSIS — H3530 Unspecified macular degeneration: Secondary | ICD-10-CM | POA: Diagnosis present

## 2021-05-19 DIAGNOSIS — I10 Essential (primary) hypertension: Secondary | ICD-10-CM | POA: Diagnosis present

## 2021-05-19 DIAGNOSIS — F0393 Unspecified dementia, unspecified severity, with mood disturbance: Secondary | ICD-10-CM | POA: Diagnosis present

## 2021-05-19 DIAGNOSIS — I160 Hypertensive urgency: Principal | ICD-10-CM | POA: Diagnosis present

## 2021-05-19 DIAGNOSIS — N1831 Chronic kidney disease, stage 3a: Secondary | ICD-10-CM | POA: Diagnosis not present

## 2021-05-19 DIAGNOSIS — Z9183 Wandering in diseases classified elsewhere: Secondary | ICD-10-CM

## 2021-05-19 DIAGNOSIS — F0390 Unspecified dementia without behavioral disturbance: Secondary | ICD-10-CM | POA: Diagnosis present

## 2021-05-19 DIAGNOSIS — Z20822 Contact with and (suspected) exposure to covid-19: Secondary | ICD-10-CM | POA: Diagnosis present

## 2021-05-19 DIAGNOSIS — G9341 Metabolic encephalopathy: Secondary | ICD-10-CM | POA: Diagnosis not present

## 2021-05-19 DIAGNOSIS — Z79899 Other long term (current) drug therapy: Secondary | ICD-10-CM

## 2021-05-19 DIAGNOSIS — Z111 Encounter for screening for respiratory tuberculosis: Secondary | ICD-10-CM

## 2021-05-19 DIAGNOSIS — R413 Other amnesia: Secondary | ICD-10-CM | POA: Diagnosis not present

## 2021-05-19 DIAGNOSIS — K219 Gastro-esophageal reflux disease without esophagitis: Secondary | ICD-10-CM | POA: Diagnosis not present

## 2021-05-19 DIAGNOSIS — E785 Hyperlipidemia, unspecified: Secondary | ICD-10-CM | POA: Diagnosis present

## 2021-05-19 DIAGNOSIS — N179 Acute kidney failure, unspecified: Secondary | ICD-10-CM | POA: Diagnosis not present

## 2021-05-19 DIAGNOSIS — Z791 Long term (current) use of non-steroidal anti-inflammatories (NSAID): Secondary | ICD-10-CM

## 2021-05-19 DIAGNOSIS — I129 Hypertensive chronic kidney disease with stage 1 through stage 4 chronic kidney disease, or unspecified chronic kidney disease: Secondary | ICD-10-CM | POA: Diagnosis present

## 2021-05-19 DIAGNOSIS — I674 Hypertensive encephalopathy: Secondary | ICD-10-CM

## 2021-05-19 HISTORY — DX: Iron deficiency anemia, unspecified: D50.9

## 2021-05-19 HISTORY — DX: Depression, unspecified: F32.A

## 2021-05-19 HISTORY — DX: Gastro-esophageal reflux disease without esophagitis: K21.9

## 2021-05-19 LAB — CBC WITH DIFFERENTIAL/PLATELET
Abs Immature Granulocytes: 0.04 10*3/uL (ref 0.00–0.07)
Basophils Absolute: 0.1 10*3/uL (ref 0.0–0.1)
Basophils Relative: 1 %
Eosinophils Absolute: 0.1 10*3/uL (ref 0.0–0.5)
Eosinophils Relative: 1 %
HCT: 38.2 % (ref 36.0–46.0)
Hemoglobin: 12 g/dL (ref 12.0–15.0)
Immature Granulocytes: 1 %
Lymphocytes Relative: 22 %
Lymphs Abs: 1.6 10*3/uL (ref 0.7–4.0)
MCH: 30.2 pg (ref 26.0–34.0)
MCHC: 31.4 g/dL (ref 30.0–36.0)
MCV: 96.2 fL (ref 80.0–100.0)
Monocytes Absolute: 0.5 10*3/uL (ref 0.1–1.0)
Monocytes Relative: 7 %
Neutro Abs: 4.9 10*3/uL (ref 1.7–7.7)
Neutrophils Relative %: 68 %
Platelets: 336 10*3/uL (ref 150–400)
RBC: 3.97 MIL/uL (ref 3.87–5.11)
RDW: 14.3 % (ref 11.5–15.5)
WBC: 7.1 10*3/uL (ref 4.0–10.5)
nRBC: 0 % (ref 0.0–0.2)

## 2021-05-19 LAB — RESP PANEL BY RT-PCR (FLU A&B, COVID) ARPGX2
Influenza A by PCR: NEGATIVE
Influenza B by PCR: NEGATIVE
SARS Coronavirus 2 by RT PCR: NEGATIVE

## 2021-05-19 LAB — CBG MONITORING, ED: Glucose-Capillary: 133 mg/dL — ABNORMAL HIGH (ref 70–99)

## 2021-05-19 LAB — TROPONIN I (HIGH SENSITIVITY): Troponin I (High Sensitivity): 12 ng/L (ref <18)

## 2021-05-19 LAB — TSH: TSH: 1.173 u[IU]/mL (ref 0.350–4.500)

## 2021-05-19 LAB — URINALYSIS, COMPLETE (UACMP) WITH MICROSCOPIC
Bacteria, UA: NONE SEEN
Bilirubin Urine: NEGATIVE
Glucose, UA: NEGATIVE mg/dL
Hgb urine dipstick: NEGATIVE
Ketones, ur: NEGATIVE mg/dL
Nitrite: NEGATIVE
Protein, ur: 30 mg/dL — AB
Specific Gravity, Urine: 1.01 (ref 1.005–1.030)
pH: 7 (ref 5.0–8.0)

## 2021-05-19 LAB — COMPREHENSIVE METABOLIC PANEL
ALT: 16 U/L (ref 0–44)
AST: 25 U/L (ref 15–41)
Albumin: 3.8 g/dL (ref 3.5–5.0)
Alkaline Phosphatase: 85 U/L (ref 38–126)
Anion gap: 5 (ref 5–15)
BUN: 21 mg/dL (ref 8–23)
CO2: 26 mmol/L (ref 22–32)
Calcium: 8.7 mg/dL — ABNORMAL LOW (ref 8.9–10.3)
Chloride: 106 mmol/L (ref 98–111)
Creatinine, Ser: 1.12 mg/dL — ABNORMAL HIGH (ref 0.44–1.00)
GFR, Estimated: 46 mL/min — ABNORMAL LOW (ref >60)
Glucose, Bld: 123 mg/dL — ABNORMAL HIGH (ref 70–99)
Potassium: 4 mmol/L (ref 3.5–5.1)
Sodium: 137 mmol/L (ref 135–145)
Total Bilirubin: 0.3 mg/dL (ref 0.3–1.2)
Total Protein: 6.9 g/dL (ref 6.5–8.1)

## 2021-05-19 LAB — CBC
HCT: 38.4 % (ref 36.0–46.0)
Hemoglobin: 12 g/dL (ref 12.0–15.0)
MCH: 30 pg (ref 26.0–34.0)
MCHC: 31.3 g/dL (ref 30.0–36.0)
MCV: 96 fL (ref 80.0–100.0)
Platelets: 322 10*3/uL (ref 150–400)
RBC: 4 MIL/uL (ref 3.87–5.11)
RDW: 14.3 % (ref 11.5–15.5)
WBC: 6.9 10*3/uL (ref 4.0–10.5)
nRBC: 0 % (ref 0.0–0.2)

## 2021-05-19 MED ORDER — LABETALOL HCL 5 MG/ML IV SOLN
5.0000 mg | Freq: Once | INTRAVENOUS | Status: AC
  Administered 2021-05-19: 5 mg via INTRAVENOUS

## 2021-05-19 MED ORDER — SERTRALINE HCL 50 MG PO TABS
25.0000 mg | ORAL_TABLET | Freq: Every day | ORAL | Status: DC
  Administered 2021-05-20 – 2021-05-25 (×6): 25 mg via ORAL
  Filled 2021-05-19 (×6): qty 1

## 2021-05-19 MED ORDER — MELOXICAM 7.5 MG PO TABS
3.7500 mg | ORAL_TABLET | Freq: Every day | ORAL | Status: DC
  Administered 2021-05-20 – 2021-05-24 (×5): 3.75 mg via ORAL
  Filled 2021-05-19 (×5): qty 0.5

## 2021-05-19 MED ORDER — LORATADINE 10 MG PO TABS: 10.0000 mg | ORAL_TABLET | Freq: Every day | ORAL | Status: DC | PRN

## 2021-05-19 MED ORDER — ONDANSETRON HCL 4 MG/2ML IJ SOLN
4.0000 mg | Freq: Three times a day (TID) | INTRAMUSCULAR | Status: DC | PRN
  Administered 2021-05-21 – 2021-05-22 (×3): 4 mg via INTRAVENOUS
  Filled 2021-05-19 (×3): qty 2

## 2021-05-19 MED ORDER — LISINOPRIL 20 MG PO TABS
40.0000 mg | ORAL_TABLET | Freq: Every day | ORAL | Status: DC
  Administered 2021-05-20 – 2021-05-24 (×5): 40 mg via ORAL
  Filled 2021-05-19 (×5): qty 2

## 2021-05-19 MED ORDER — ACETAMINOPHEN 325 MG PO TABS
650.0000 mg | ORAL_TABLET | Freq: Four times a day (QID) | ORAL | Status: DC | PRN
  Administered 2021-05-20 – 2021-05-21 (×2): 650 mg via ORAL
  Filled 2021-05-19 (×2): qty 2

## 2021-05-19 MED ORDER — HYDRALAZINE HCL 50 MG PO TABS
25.0000 mg | ORAL_TABLET | Freq: Three times a day (TID) | ORAL | Status: DC
  Administered 2021-05-19 – 2021-05-23 (×10): 25 mg via ORAL
  Filled 2021-05-19 (×10): qty 1

## 2021-05-19 MED ORDER — HYDRALAZINE HCL 20 MG/ML IJ SOLN
10.0000 mg | Freq: Once | INTRAMUSCULAR | Status: AC
  Administered 2021-05-19: 10 mg via INTRAVENOUS
  Filled 2021-05-19: qty 1

## 2021-05-19 MED ORDER — AMLODIPINE BESYLATE 10 MG PO TABS
10.0000 mg | ORAL_TABLET | Freq: Every day | ORAL | Status: DC
  Administered 2021-05-20 – 2021-05-25 (×6): 10 mg via ORAL
  Filled 2021-05-19 (×6): qty 1

## 2021-05-19 MED ORDER — HYDRALAZINE HCL 20 MG/ML IJ SOLN
5.0000 mg | INTRAMUSCULAR | Status: DC | PRN
  Administered 2021-05-19 – 2021-05-21 (×2): 5 mg via INTRAVENOUS
  Filled 2021-05-19: qty 1

## 2021-05-19 MED ORDER — ATENOLOL 25 MG PO TABS
25.0000 mg | ORAL_TABLET | Freq: Every day | ORAL | Status: DC
  Administered 2021-05-20 – 2021-05-25 (×6): 25 mg via ORAL
  Filled 2021-05-19 (×6): qty 1

## 2021-05-19 MED ORDER — PANTOPRAZOLE SODIUM 40 MG PO TBEC
40.0000 mg | DELAYED_RELEASE_TABLET | Freq: Every day | ORAL | Status: DC
  Administered 2021-05-20 – 2021-05-24 (×5): 40 mg via ORAL
  Filled 2021-05-19 (×6): qty 1

## 2021-05-19 MED ORDER — LABETALOL HCL 5 MG/ML IV SOLN
5.0000 mg | Freq: Once | INTRAVENOUS | Status: AC
  Administered 2021-05-19: 5 mg via INTRAVENOUS
  Filled 2021-05-19: qty 4

## 2021-05-19 MED ORDER — OCUVITE-LUTEIN PO CAPS
1.0000 | ORAL_CAPSULE | Freq: Every day | ORAL | Status: DC
  Administered 2021-05-20 – 2021-05-21 (×2): 1 via ORAL
  Filled 2021-05-19 (×2): qty 1

## 2021-05-19 MED ORDER — FERROUS SULFATE 325 (65 FE) MG PO TABS
325.0000 mg | ORAL_TABLET | Freq: Every day | ORAL | Status: DC
  Administered 2021-05-20 – 2021-05-25 (×6): 325 mg via ORAL
  Filled 2021-05-19 (×6): qty 1

## 2021-05-19 MED ORDER — VITAMIN D 25 MCG (1000 UNIT) PO TABS
1000.0000 [IU] | ORAL_TABLET | Freq: Every day | ORAL | Status: DC
  Administered 2021-05-20 – 2021-05-25 (×6): 1000 [IU] via ORAL
  Filled 2021-05-19 (×6): qty 1

## 2021-05-19 MED ORDER — MIRTAZAPINE 15 MG PO TABS
7.5000 mg | ORAL_TABLET | Freq: Every day | ORAL | Status: DC
  Administered 2021-05-20 – 2021-05-24 (×5): 7.5 mg via ORAL
  Filled 2021-05-19 (×5): qty 1

## 2021-05-19 MED ORDER — ENOXAPARIN SODIUM 30 MG/0.3ML IJ SOSY
30.0000 mg | PREFILLED_SYRINGE | INTRAMUSCULAR | Status: DC
  Administered 2021-05-20 – 2021-05-24 (×5): 30 mg via SUBCUTANEOUS
  Filled 2021-05-19 (×5): qty 0.3

## 2021-05-19 MED ORDER — ATORVASTATIN CALCIUM 20 MG PO TABS
10.0000 mg | ORAL_TABLET | Freq: Every day | ORAL | Status: DC
  Administered 2021-05-20 – 2021-05-24 (×5): 10 mg via ORAL
  Filled 2021-05-19 (×5): qty 1

## 2021-05-19 NOTE — Consult Note (Signed)
[facility] Face-to-Face Psychiatry Consult   Reason for Consult:  capacity evaluation Referring Physician:  EDP Patient Identification: Suzanne Beltran MRN:  [id number] Principal Diagnosis: Hypertensive urgency Diagnosis:  Principal Problem:   Hypertensive urgency Active Problems:   Memory deficit   Acute metabolic encephalopathy   GERD (gastroesophageal reflux disease)   Depression   Iron deficiency anemia   Dementia (HCC)   Chronic kidney disease, stage 3a (HCC)   HTN (hypertension)   Total Time spent with patient: 1 hour  Subjective:   Suzanne Beltran is a 88 y.o. female patient admitted with memory deficits.  HPI:   88 yo female presented after being found wandering in her yard, lost.  Her daughter and son-in-law are at her bedside.  On assessment, she does not know why she is in the hospital, "surprise to me."  She did not remember getting lost in her yard today.  When asked what she would do if she fell in her home, she could not answer.  "I hold onto chairs and I have neighbors who are close."  Pleasant, confused on the assessment at times, and reports feeling "safe" in her home yet states she blocks the doors with chairs, self-reports with family confirming.  Daughter and son-in-law met with this provider in the hall with Ms Scoggin's permission.  They state her memory has been increasingly worsening and this morning, she got lost in her front yard as she could not find the front door despite living in her home for 77 years.  The children next door told their mother who called the family.  The neighbors who she reports being "close to and takes care of each other" have been dead for awhile.  She is forgetting to eat and will not allow the family to have hired help in the home, becomes "hostile".  Macular degeneration is progressing with losing of vision compounding her issues.  Family visits daily and has set things in place to assist her in the home to no success at this point, unsafe to  continue to live alone.  She does not have capacity to make sound decisions at this time.  Past Psychiatric History: memory issues  Risk to Self:  related to memory issues Risk to Others:  none Prior Inpatient Therapy:  none Prior Outpatient Therapy:  none  Past Medical History:  Past Medical History:  Diagnosis Date   Depression    GERD (gastroesophageal reflux disease)    Hypertension    Iron deficiency anemia     Past Surgical History:  Procedure Laterality Date   ABDOMINAL HYSTERECTOMY     Family History: No family history on file. Family Psychiatric  History: none Social History:  Social History   Substance and Sexual Activity  Alcohol Use Never     Social History   Substance and Sexual Activity  Drug Use Never    Social History   Socioeconomic History   Marital status: Married    Spouse name: Not on file   Number of children: Not on file   Years of education: Not on file   Highest education level: Not on file  Occupational History   Not on file  Tobacco Use   Smoking status: Never   Smokeless tobacco: Never  Substance and Sexual Activity   Alcohol use: Never   Drug use: Never   Sexual activity: Never  Other Topics Concern   Not on file  Social History Narrative   Not on file   Social Determinants  of Health   Financial Resource Strain: Not on file  Food Insecurity: Not on file  Transportation Needs: Not on file  Physical Activity: Not on file  Stress: Not on file  Social Connections: Not on file   Additional Social History:    Allergies:  No Known Allergies  Labs:  Results for orders placed or performed during the hospital encounter of 05/19/21 (from the past 48 hour(s))  CBC with Differential/Platelet     Status: None   Collection Time: 05/19/21  1:54 PM  Result Value Ref Range   WBC 7.1 4.0 - 10.5 K/uL   RBC 3.97 3.87 - 5.11 MIL/uL   Hemoglobin 12.0 12.0 - 15.0 g/dL   HCT 38.2 36.0 - 46.0 %   MCV 96.2 80.0 - 100.0 fL   MCH 30.2  26.0 - 34.0 pg   MCHC 31.4 30.0 - 36.0 g/dL   RDW 14.3 11.5 - 15.5 %   Platelets 336 150 - 400 K/uL   nRBC 0.0 0.0 - 0.2 %   Neutrophils Relative % 68 %   Neutro Abs 4.9 1.7 - 7.7 K/uL   Lymphocytes Relative 22 %   Lymphs Abs 1.6 0.7 - 4.0 K/uL   Monocytes Relative 7 %   Monocytes Absolute 0.5 0.1 - 1.0 K/uL   Eosinophils Relative 1 %   Eosinophils Absolute 0.1 0.0 - 0.5 K/uL   Basophils Relative 1 %   Basophils Absolute 0.1 0.0 - 0.1 K/uL   Immature Granulocytes 1 %   Abs Immature Granulocytes 0.04 0.00 - 0.07 K/uL    Comment: Performed at [facility], [address] [place], [place] [address]  CBG monitoring, ED     Status: Abnormal   Collection Time: 05/19/21  2:30 PM  Result Value Ref Range   Glucose-Capillary 133 (H) 70 - 99 mg/dL    Comment: Glucose reference range applies only to samples taken after fasting for at least 8 hours.  Comprehensive metabolic panel     Status: Abnormal   Collection Time: 05/19/21  2:44 PM  Result Value Ref Range   Sodium 137 135 - 145 mmol/L   Potassium 4.0 3.5 - 5.1 mmol/L   Chloride 106 98 - 111 mmol/L   CO2 26 22 - 32 mmol/L   Glucose, Bld 123 (H) 70 - 99 mg/dL    Comment: Glucose reference range applies only to samples taken after fasting for at least 8 hours.   BUN 21 8 - 23 mg/dL   Creatinine, Ser 1.12 (H) 0.44 - 1.00 mg/dL   Calcium 8.7 (L) 8.9 - 10.3 mg/dL   Total Protein 6.9 6.5 - 8.1 g/dL   Albumin 3.8 3.5 - 5.0 g/dL   AST 25 15 - 41 U/L   ALT 16 0 - 44 U/L   Alkaline Phosphatase 85 38 - 126 U/L   Total Bilirubin 0.3 0.3 - 1.2 mg/dL   GFR, Estimated 46 (L) >60 mL/min    Comment: (NOTE) Calculated using the CKD-EPI Creatinine Equation (2021)    Anion gap 5 5 - 15    Comment: Performed at [facility], [address] [place], [place] [address]  CBC     Status: None   Collection Time: 05/19/21  2:44 PM  Result Value Ref Range   WBC 6.9 4.0 - 10.5 K/uL   RBC 4.00 3.87 - 5.11 MIL/uL   Hemoglobin 12.0  12.0 - 15.0 g/dL   HCT 38.4 36.0 - 46.0 %   MCV 96.0  80.0 - 100.0 fL   MCH 30.0 26.0 - 34.0 pg   MCHC 31.3 30.0 - 36.0 g/dL   RDW 14.3 11.5 - 15.5 %   Platelets 322 150 - 400 K/uL   nRBC 0.0 0.0 - 0.2 %    Comment: Performed at [facility], [address], [place], [place] [address]  Troponin I (High Sensitivity)     Status: None   Collection Time: 05/19/21  2:44 PM  Result Value Ref Range   Troponin I (High Sensitivity) 12 <18 ng/L    Comment: (NOTE) Elevated high sensitivity troponin I (hsTnI) values and significant  changes across serial measurements may suggest ACS but many other  chronic and acute conditions are known to elevate hsTnI results.  Refer to the "Links" section for chest pain algorithms and additional  guidance. Performed at [facility], [address] [place], [place] [address]   TSH     Status: None   Collection Time: 05/19/21  3:16 PM  Result Value Ref Range   TSH 1.173 0.350 - 4.500 uIU/mL    Comment: Performed by a 3rd Generation assay with a functional sensitivity of <=0.01 uIU/mL. Performed at [facility], [address] [place], [place] [address]     Current Facility-Administered Medications  Medication Dose Route Frequency Provider Last Rate Last Admin   acetaminophen (TYLENOL) tablet 650 mg  650 mg Oral Q6H PRN Ivor Costa, MD       enoxaparin (LOVENOX) injection 30 mg  30 mg Subcutaneous Q24H Ivor Costa, MD       hydrALAZINE (APRESOLINE) injection 5 mg  5 mg Intravenous Q2H PRN Ivor Costa, MD       ondansetron [facility]) injection 4 mg  4 mg Intravenous Q8H PRN Ivor Costa, MD       Current Outpatient Medications  Medication Sig Dispense Refill   acetaminophen (TYLENOL) 500 MG tablet Take 500 mg by mouth as needed for mild pain or moderate pain.      amLODipine (NORVASC) 10 MG tablet Take 10 mg by mouth daily.     atenolol (TENORMIN) 25 MG tablet Take 25 mg by mouth daily.     cetirizine (ZYRTEC) 10 MG tablet Take  10 mg by mouth daily as needed for allergies.      ferrous sulfate 325 (65 FE) MG tablet Take 325 mg by mouth daily with breakfast.     lisinopril (PRINIVIL,ZESTRIL) 40 MG tablet Take 40 mg by mouth daily.     meloxicam (MOBIC) 7.5 MG tablet Take 3.75 mg by mouth daily.     mirtazapine (REMERON) 15 MG tablet Take 7.5 mg by mouth at bedtime.     Multiple Vitamins-Minerals (ICAPS PO) Take 1 capsule by mouth daily.     omeprazole (PRILOSEC) 20 MG capsule Take 20 mg by mouth daily as needed (heartburn).      sertraline (ZOLOFT) 50 MG tablet Take 25 mg by mouth daily.      Musculoskeletal: Strength & Muscle Tone: within normal limits Gait & Station: normal Patient leans: N/A  Psychiatric Specialty Exam: Physical Exam Vitals and nursing note reviewed.  Constitutional:      Appearance: Normal appearance.  HENT:     Head: Normocephalic.     Nose: Nose normal.  Pulmonary:     Effort: Pulmonary effort is normal.  Musculoskeletal:        General: Normal range of motion.     Cervical back: Normal range of motion.  Neurological:  General: No focal deficit present.     Mental Status: She is alert.  Psychiatric:        Attention and Perception: Attention and perception normal.        Mood and Affect: Mood is anxious.        Speech: Speech normal.        Behavior: Behavior normal. Behavior is cooperative.        Thought Content: Thought content normal.        Cognition and Memory: Memory is impaired.        Judgment: Judgment normal.    Review of Systems  Psychiatric/Behavioral:  Positive for memory loss. The patient is nervous/anxious.   All other systems reviewed and are negative.  Blood pressure (!) 215/74, pulse 61, temperature 98.7 F (37.1 C), temperature source Oral, resp. rate 19, height $RemoveBe'4\' 11"'BJepPLXrx$  (1.499 m), weight 41.7 kg, SpO2 99 %.Body mass index is 18.57 kg/m.  General Appearance: Casual  Eye Contact:  Good  Speech:  Normal Rate  Volume:  Normal  Mood:  Anxious  Affect:   Congruent  Thought Process:  Coherent and Descriptions of Associations: Intact  Orientation:  Other:  person and place  Thought Content:  Logical  Suicidal Thoughts:  No  Homicidal Thoughts:  No  Memory:  Immediate;   Fair Recent;   Poor Remote;   Fair  Judgement:  Poor  Insight:  Lacking  Psychomotor Activity:  Normal  Concentration:  Concentration: Fair and Attention Span: Fair  Recall:  [employer] of Knowledge:  Good  Language:  Good  Akathisia:  No  Handed:  Right  AIMS (if indicated):     Assets:  Housing Leisure Time Resilience Social Support  ADL's:  Intact  Cognition:  Impaired,  Moderate  Sleep:       Physical Exam: Physical Exam Vitals and nursing note reviewed.  Constitutional:      Appearance: Normal appearance.  HENT:     Head: Normocephalic.     Nose: Nose normal.  Pulmonary:     Effort: Pulmonary effort is normal.  Musculoskeletal:        General: Normal range of motion.     Cervical back: Normal range of motion.  Neurological:     General: No focal deficit present.     Mental Status: She is alert.  Psychiatric:        Attention and Perception: Attention and perception normal.        Mood and Affect: Mood is anxious.        Speech: Speech normal.        Behavior: Behavior normal. Behavior is cooperative.        Thought Content: Thought content normal.        Cognition and Memory: Memory is impaired.        Judgment: Judgment normal.   Review of Systems  Psychiatric/Behavioral:  Positive for memory loss. The patient is nervous/anxious.   All other systems reviewed and are negative. Blood pressure (!) 215/74, pulse 61, temperature 98.7 F (37.1 C), temperature source Oral, resp. rate 19, height $RemoveBe'4\' 11"'IElkSPJoF$  (1.499 m), weight 41.7 kg, SpO2 99 %. Body mass index is 18.57 kg/m.  Treatment Plan Summary: Memory deficits, capacity evaluation: Client does not have capacity to make decisions at this time.  TOC consult in place for SNF  placement.  Disposition: No evidence of imminent risk to self or others at present.   Patient does not meet criteria for psychiatric inpatient  admission. Supportive therapy provided about ongoing stressors.  Waylan Boga, NP 05/19/2021 4:59 PM

## 2021-05-19 NOTE — Progress Notes (Signed)
Patient confused and having trouble understanding why she is in the hospital. Patient assisted to call her daughter Katharine Look, per request.

## 2021-05-19 NOTE — ED Provider Notes (Signed)
[facility] Provider Note    Event Date/Time   First MD Initiated Contact with Patient 05/19/21 1502     (approximate)   History   Altered Mental Status   HPI  Suzanne Beltran is a 88 y.o. female who lives at home by herself.  She lives in the same house that she lived in with her husband.  He died 7 years ago.  She has been in the house 77 years.  She will not have anyone else's spend the night in the house with her except immediate family.  Today she was slightly seen by neighbors wandering in the yard.  The neighbors found her in the yard and brought her in their house.  She reportedly could not find her way back into her own house.  Family comes to see her every day gives her her medicines eats with her etc.  They have taken out all of the dangerous things out of the house including sharp knives and cleaning chemicals because she has progressive dementia.  She has never been quite this bad before.  Patient did get her medicines including her amp antihypertensives this morning.      Physical Exam   Triage Vital Signs: ED Triage Vitals [05/19/21 1430]  Enc Vitals Group     BP (!) 238/92     Pulse Rate 62     Resp 17     Temp 98.7 F (37.1 C)     Temp Source Oral     SpO2 97 %     Weight 91 lb 14.9 oz (41.7 kg)     Height 4\' 11"  (1.499 m)     Head Circumference      Peak Flow      Pain Score 0     Pain Loc      Pain Edu?      Excl. in GC?     Most recent vital signs: Vitals:   05/19/21 1550 05/19/21 1600  BP: (!) 212/79 (!) 193/85  Pulse: 60 63  Resp: (!) 22 (!) 21  Temp:    SpO2: 98% 99%     General: Awake, no distress.  Head normocephalic atraumatic Eyes pupils equal round react light extraocular movements intact fundi look normal CV:  Good peripheral perfusion.  Heart regular rate and rhythm no audible murmur Resp:  Normal effort.  Lungs are clear Abd:  No distention.  Abdomen soft bowel sounds are positive there is no palpable  organomegaly Extremities with no edema Neurological exam cranial nerves II through XII appear to be intact motor strength appears normal throughout and patient does not report any numbness.   ED Results / Procedures / Treatments   Labs (all labs ordered are listed, but only abnormal results are displayed) Labs Reviewed  COMPREHENSIVE METABOLIC PANEL - Abnormal; Notable for the following components:      Result Value   Glucose, Bld 123 (*)    Creatinine, Ser 1.12 (*)    Calcium 8.7 (*)    GFR, Estimated 46 (*)    All other components within normal limits  CBG MONITORING, ED - Abnormal; Notable for the following components:   Glucose-Capillary 133 (*)    All other components within normal limits  CBC  CBC WITH DIFFERENTIAL/PLATELET  URINALYSIS, COMPLETE (UACMP) WITH MICROSCOPIC  TSH  FOLATE RBC  VITAMIN B12  TROPONIN I (HIGH SENSITIVITY)  TROPONIN I (HIGH SENSITIVITY)     EKG  EKG read interpreted by me shows  normal sinus rhythm rate of 63 normal axis some slightly lower than usual amplitude but otherwise no acute disease   RADIOLOGY X-ray read by radiology reviewed by me shows no acute disease CT of the head read by radiology reviewed by me also shows no acute disease   PROCEDURES:  Critical Care performed:   Procedures   MEDICATIONS ORDERED IN ED: Medications  labetalol (NORMODYNE) injection 5 mg (5 mg Intravenous Given 05/19/21 1542)  labetalol (NORMODYNE) injection 5 mg (5 mg Intravenous Given 05/19/21 1600)     IMPRESSION / MDM / ASSESSMENT AND PLAN / ED COURSE  I reviewed the triage vital signs and the nursing notes.  Family is very worried they have been trying to get her placed for about a year but she is refusing.  They cannot have someone in the house at all times themselves because they also have work etc.  Patient will not let anybody but immediate family member to be in the house with her at all the time.   Patient's blood pressure is very high at  220/80 229/92.  It has been resistant to labetalol.  We will admit this patient for possible hypertensive encephalopathy and decrease in her GFR almost approaching AKI.   the patient is on the cardiac monitor to evaluate for evidence of arrhythmia and/or significant heart rate changes.    FINAL CLINICAL IMPRESSION(S) / ED DIAGNOSES   Final diagnoses:  Altered mental status, unspecified altered mental status type  Hypertensive encephalopathy     Rx / DC Orders   ED Discharge Orders     None        Note:  This document was prepared using Dragon voice recognition software and may include unintentional dictation errors.   Arnaldo Natal, MD 05/19/21 1606

## 2021-05-19 NOTE — ED Notes (Signed)
Transportation requested  

## 2021-05-19 NOTE — H&P (Signed)
History and Physical    Suzanne Beltran EOF:[id number] DOB: Dec 31, 1924 DOA: 05/19/2021  Referring MD/NP/PA:   PCP: Kandyce Rud, MD   Patient coming from:  The patient is coming from home.   Chief Complaint: confusion   HPI: Suzanne Beltran is a 88 y.o. female with medical history significant of hypertension, hyperlipidemia, iron deficiency anemia, GERD, depression, CKD stage IIIa, dementia, who presents with confusion.  Per patient's daughter and the son-in-law at the bedside, patient was found wandering outside in yard by neighbors.  Patient could not find a way home, and could not recognize her house where she has been living there for 77 years.  At her normal baseline, patient recognize family members, most of the time she is orientated to place, but not oriented to time.  Her mental status seems to have improved when I saw patient.  She knows her own name, recognize her daughter and her son-in-law, knows that she is in the hospital, but confused about time.  Per her daughter, currently her mental status is back to baseline.  Patient does not have chest pain, cough, shortness of breath.  No nausea, vomiting, diarrhea or abdominal pain.  No symptoms of UTI.  Moves all extremities normally.  No facial droop or slurred speech.  Patient was found to have elevated BP 238/92, which improved to 193/85 after giving labetalol IV 5 mg x 2.  Per her daughter, patient has been compliant in taking her blood pressure medications, including amlodipine 10 mg daily, atenolol 25 mg daily, lisinopril 40 mg daily.  Data Reviewed and ED Course: pt was found to have WBC 6.9, troponin level 12, pending COVID PCR, stable renal function, temperature normal, heart rate 59-63, RR 22, oxygen saturation 99% on room air.  Chest x-ray negative.  CT head is negative for acute intracranial abnormalities.  Patient is a priest on telemetry bed for observation.   EKG: I have personally reviewed.  Sinus rhythm, QTc 407,  low-grade, nonspecific T wave change  Review of Systems:   General: no fevers, chills, no body weight gain, fatigue HEENT: no blurry vision, hearing changes or sore throat Respiratory: no dyspnea, coughing, wheezing CV: no chest pain, no palpitations GI: no nausea, vomiting, abdominal pain, diarrhea, constipation GU: no dysuria, burning on urination, increased urinary frequency, hematuria  Ext: no leg edema Neuro: no unilateral weakness, numbness, or tingling, no vision change or hearing loss.  Has confusion Skin: no rash, no skin tear. MSK: No muscle spasm, no deformity, no limitation of range of movement in spin Heme: No easy bruising.  Travel history: No recent long distant travel.   Allergy: No Known Allergies  Past Medical History:  Diagnosis Date   Depression    GERD (gastroesophageal reflux disease)    Hypertension    Iron deficiency anemia     Past Surgical History:  Procedure Laterality Date   ABDOMINAL HYSTERECTOMY      Social History:  reports that she has never smoked. She has never used smokeless tobacco. She reports that she does not drink alcohol and does not use drugs.  Family History:  Could not be reviewed accurately due to dementia  Prior to Admission medications   Medication Sig Start Date End Date Taking? Authorizing Provider  acetaminophen (TYLENOL) 500 MG tablet Take 500 mg by mouth as needed for mild pain or moderate pain.     [provider]  amLODipine (NORVASC) 10 MG tablet Take 10 mg by mouth daily.    [provider]  atenolol (TENORMIN) 25 MG tablet Take 25 mg by mouth daily.    [provider]  cetirizine (ZYRTEC) 10 MG tablet Take 10 mg by mouth daily as needed for allergies.     [provider]  ferrous sulfate 325 (65 FE) MG tablet Take 325 mg by mouth daily with breakfast.    [provider]  lisinopril (PRINIVIL,ZESTRIL) 40 MG tablet Take 40 mg by mouth daily.    [provider]   meloxicam (MOBIC) 7.5 MG tablet Take 3.75 mg by mouth daily.    [provider]  mirtazapine (REMERON) 15 MG tablet Take 7.5 mg by mouth at bedtime.    [provider]  Multiple Vitamins-Minerals (ICAPS PO) Take 1 capsule by mouth daily.    [provider]  omeprazole (PRILOSEC) 20 MG capsule Take 20 mg by mouth daily as needed (heartburn).     [provider]  sertraline (ZOLOFT) 50 MG tablet Take 25 mg by mouth daily.    [provider]    Physical Exam: Vitals:   05/19/21 1640 05/19/21 1710 05/19/21 1720 05/19/21 1730  BP: (!) 215/74 (!) 176/68 (!) 180/73 (!) 180/73  Pulse: 61 70 72 77  Resp:  14 19 17   Temp:      TempSrc:      SpO2: 99% 98% 97% 97%  Weight:      Height:       General: Not in acute distress HEENT:       Eyes: PERRL, EOMI, no scleral icterus.       ENT: No discharge from the ears and nose, no pharynx injection, no tonsillar enlargement.        Neck: No JVD, no bruit, no mass felt. Heme: No neck lymph node enlargement. Cardiac: S1/S2, RRR, No murmurs, No gallops or rubs. Respiratory: No rales, wheezing, rhonchi or rubs. GI: Soft, nondistended, nontender, no rebound pain, no organomegaly, BS present. GU: No hematuria Ext: No pitting leg edema bilaterally. 1+DP/PT pulse bilaterally. Musculoskeletal: No joint deformities, No joint redness or warmth, no limitation of ROM in spin. Skin: No rashes.  Neuro: Alert, oriented person and place, not to time, cranial nerves II-XII grossly intact, moves all extremities normally. Psych: Patient is not psychotic, no suicidal or hemocidal ideation.  Labs on Admission: I have personally reviewed following labs and imaging studies  CBC: Recent Labs  Lab 05/19/21 1354 05/19/21 1444  WBC 7.1 6.9  NEUTROABS 4.9  --   HGB 12.0 12.0  HCT 38.2 38.4  MCV 96.2 96.0  PLT 336 [id number]   Basic Metabolic Panel: Recent Labs  Lab 05/19/21 1444  NA 137  K 4.0  CL 106  CO2 26  GLUCOSE  123*  BUN 21  CREATININE 1.12*  CALCIUM 8.7*   GFR: Estimated Creatinine Clearance: 20.2 mL/min (A) (by C-G formula based on SCr of 1.12 mg/dL (H)). Liver Function Tests: Recent Labs  Lab 05/19/21 1444  AST 25  ALT 16  ALKPHOS 85  BILITOT 0.3  PROT 6.9  ALBUMIN 3.8   No results for input(s): LIPASE, AMYLASE in the last 168 hours. No results for input(s): AMMONIA in the last 168 hours. Coagulation Profile: No results for input(s): INR, PROTIME in the last 168 hours. Cardiac Enzymes: No results for input(s): CKTOTAL, CKMB, CKMBINDEX, TROPONINI in the last 168 hours. BNP (last 3 results) No results for input(s): PROBNP in the last 8760 hours. HbA1C: No results for input(s): HGBA1C in the last 72 hours.  CBG: Recent Labs  Lab 05/19/21 1430  GLUCAP 133*   Lipid Profile: No results for input(s): CHOL, HDL, LDLCALC, TRIG, CHOLHDL, LDLDIRECT in the last 72 hours. Thyroid Function Tests: Recent Labs    05/19/21 1516  TSH 1.173   Anemia Panel: No results for input(s): VITAMINB12, FOLATE, FERRITIN, TIBC, IRON, RETICCTPCT in the last 72 hours. Urine analysis:    Component Value Date/Time   COLORURINE YELLOW (A) 10/07/2017 1659   APPEARANCEUR HAZY (A) 10/07/2017 1659   APPEARANCEUR Clear 11/18/2013 2219   LABSPEC 1.008 10/07/2017 1659   LABSPEC 1.008 11/18/2013 2219   PHURINE 6.0 10/07/2017 1659   GLUCOSEU NEGATIVE 10/07/2017 1659   GLUCOSEU Negative 11/18/2013 2219   HGBUR NEGATIVE 10/07/2017 1659   BILIRUBINUR NEGATIVE 10/07/2017 1659   BILIRUBINUR Negative 11/18/2013 [place] 10/07/2017 1659   PROTEINUR 100 (A) 10/07/2017 1659   NITRITE NEGATIVE 10/07/2017 1659   LEUKOCYTESUR NEGATIVE 10/07/2017 1659   LEUKOCYTESUR 1+ 11/18/2013 2219   Sepsis Labs: @LABRCNTIP (procalcitonin:4,lacticidven:4) )No results found for this or any previous visit (from the past 240 hour(s)).   Radiological Exams on Admission: CT Head Wo Contrast  Result Date:  05/19/2021 CLINICAL DATA:  Altered mid pole status. Patient found wandering in her yard this morning. EXAM: CT HEAD WITHOUT CONTRAST TECHNIQUE: Contiguous axial images were obtained from the base of the skull through the vertex without intravenous contrast. RADIATION DOSE REDUCTION: This exam was performed according to the departmental dose-optimization program which includes automated exposure control, adjustment of the mA and/or kV according to patient size and/or use of iterative reconstruction technique. COMPARISON:  None. FINDINGS: Brain: No evidence of acute infarction, hemorrhage, hydrocephalus, extra-axial collection or mass lesion/mass effect. There is ventricular and sulcal enlargement consistent with age appropriate volume loss. Patchy areas of white matter hypoattenuation are noted bilaterally consistent with moderate chronic microvascular ischemic change. Vascular: No hyperdense vessel or unexpected calcification. Skull: Normal. Negative for fracture or focal lesion. Sinuses/Orbits: Globes and orbits are unremarkable. Visualized sinuses are clear. Other: None. IMPRESSION: 1. No acute intracranial abnormalities. Electronically Signed   By: Lajean Manes M.D.   On: 05/19/2021 15:39   DG Chest Port 1 View  Result Date: 05/19/2021 CLINICAL DATA:  Altered mental status. Found in the yard this morning wandering. EXAM: PORTABLE CHEST 1 VIEW COMPARISON:  None. FINDINGS: Cardiac silhouette is normal in size. Moderate size hiatal hernia. No mediastinal or hilar masses. Clear lungs.  No convincing pleural effusion or pneumothorax. Skeletal structures are demineralized, but grossly intact. IMPRESSION: No acute cardiopulmonary disease. Electronically Signed   By: Lajean Manes M.D.   On: 05/19/2021 15:37      Assessment/Plan Principal Problem:   Hypertensive urgency Active Problems:   Dementia (HCC)   Acute metabolic encephalopathy   HTN (hypertension)   Iron deficiency anemia   Depression   Chronic  kidney disease, stage 3a (HCC)   HLD (hyperlipidemia)  Hypertensive urgency and hx of HTN: BP 238/92, which improved to 193/85 after giving labetalol IV 5 mg x 2.  -will place in tele bed for obs -continue home meds: amlodipine 10 mg daily, atenolol 25 mg daily, lisinopril 40 mg daily. -IV prn hydralazine -will add oral hydralazine 25 mg 3 times daily  Dementia and acute metabolic encephalopathy: Patient's mental status is back to baseline.  Etiology is not clear for her acute mental status change.  Differential diagnosis include progression of her dementia, delirium, hypertensive encephalopathy.  CT head negative.  No focal neurodeficit. -Frequent neuro checks -  Vitamin 123456, TSH, folic acid level were ordered by EDP -consult TOC, PT/OT -fall precaution  Depression -Continue sertraline, mirtazapine  Iron deficiency anemia -continue iron supplement  HLD: -Lipitor  Chronic kidney disease, stage 3a ([place]): stable, Cre 1.12 -F/u by BMP       DVT ppx: SQ Lovenox  Code Status: Full code per her daughter  Family Communication:   Yes, patient's  daughter and son-in law  at bed side.   Disposition Plan:  to be determined  Consults called:  none  Admission status and Level of care: Telemetry Medical:   for obs     Severity of Illness:  The appropriate patient status for this patient is OBSERVATION. Observation status is judged to be reasonable and necessary in order to provide the required intensity of service to ensure the patient's safety. The patient's presenting symptoms, physical exam findings, and initial radiographic and laboratory data in the context of their medical condition is felt to place them at decreased risk for further clinical deterioration. Furthermore, it is anticipated that the patient will be medically stable for discharge from the hospital within 2 midnights of admission.        Date of Service 05/19/2021    Ivor Costa [facility]   If  7PM-7AM, please contact night-coverage [email or web address] 05/19/2021, 5:47 PM

## 2021-05-19 NOTE — ED Notes (Signed)
Pt is alert, oriented to person, place, and situation only. Family state her mentation is baseline. Pt takes losartan per family, they state she had regular dose today.

## 2021-05-19 NOTE — ED Notes (Signed)
Pt in CT.

## 2021-05-19 NOTE — ED Triage Notes (Signed)
Patient brought in by daughter and son-in law for AMS. Patient found out in yard this morning wandering. Patient has hx of dementia, but different than baseline. Patient hypertensive, BP  238/92 in triage.

## 2021-05-19 NOTE — ED Notes (Signed)
Pt assisted to toilet, pt ambulatory with minimal assistance.

## 2021-05-19 NOTE — ED Notes (Signed)
Pt given dinner tray.

## 2021-05-20 DIAGNOSIS — Z9183 Wandering in diseases classified elsewhere: Secondary | ICD-10-CM | POA: Diagnosis not present

## 2021-05-20 DIAGNOSIS — F02C11 Dementia in other diseases classified elsewhere, severe, with agitation: Secondary | ICD-10-CM | POA: Diagnosis not present

## 2021-05-20 DIAGNOSIS — Z79899 Other long term (current) drug therapy: Secondary | ICD-10-CM | POA: Diagnosis not present

## 2021-05-20 DIAGNOSIS — G301 Alzheimer's disease with late onset: Secondary | ICD-10-CM | POA: Diagnosis not present

## 2021-05-20 DIAGNOSIS — H3530 Unspecified macular degeneration: Secondary | ICD-10-CM | POA: Diagnosis present

## 2021-05-20 DIAGNOSIS — E785 Hyperlipidemia, unspecified: Secondary | ICD-10-CM | POA: Diagnosis present

## 2021-05-20 DIAGNOSIS — I674 Hypertensive encephalopathy: Secondary | ICD-10-CM | POA: Diagnosis present

## 2021-05-20 DIAGNOSIS — Z20822 Contact with and (suspected) exposure to covid-19: Secondary | ICD-10-CM | POA: Diagnosis present

## 2021-05-20 DIAGNOSIS — Z791 Long term (current) use of non-steroidal anti-inflammatories (NSAID): Secondary | ICD-10-CM | POA: Diagnosis not present

## 2021-05-20 DIAGNOSIS — K219 Gastro-esophageal reflux disease without esophagitis: Secondary | ICD-10-CM | POA: Diagnosis present

## 2021-05-20 DIAGNOSIS — N179 Acute kidney failure, unspecified: Secondary | ICD-10-CM | POA: Diagnosis not present

## 2021-05-20 DIAGNOSIS — N1831 Chronic kidney disease, stage 3a: Secondary | ICD-10-CM | POA: Diagnosis present

## 2021-05-20 DIAGNOSIS — G9341 Metabolic encephalopathy: Secondary | ICD-10-CM | POA: Diagnosis not present

## 2021-05-20 DIAGNOSIS — F03918 Unspecified dementia, unspecified severity, with other behavioral disturbance: Secondary | ICD-10-CM | POA: Diagnosis present

## 2021-05-20 DIAGNOSIS — F0283 Dementia in other diseases classified elsewhere, unspecified severity, with mood disturbance: Secondary | ICD-10-CM | POA: Diagnosis not present

## 2021-05-20 DIAGNOSIS — F0393 Unspecified dementia, unspecified severity, with mood disturbance: Secondary | ICD-10-CM | POA: Diagnosis present

## 2021-05-20 DIAGNOSIS — I129 Hypertensive chronic kidney disease with stage 1 through stage 4 chronic kidney disease, or unspecified chronic kidney disease: Secondary | ICD-10-CM | POA: Diagnosis present

## 2021-05-20 DIAGNOSIS — D509 Iron deficiency anemia, unspecified: Secondary | ICD-10-CM | POA: Diagnosis present

## 2021-05-20 DIAGNOSIS — R4182 Altered mental status, unspecified: Secondary | ICD-10-CM | POA: Diagnosis present

## 2021-05-20 DIAGNOSIS — I160 Hypertensive urgency: Secondary | ICD-10-CM | POA: Diagnosis present

## 2021-05-20 LAB — VITAMIN B12: Vitamin B-12: 287 pg/mL (ref 180–914)

## 2021-05-20 MED ORDER — HALOPERIDOL 0.5 MG PO TABS
0.5000 mg | ORAL_TABLET | Freq: Four times a day (QID) | ORAL | Status: DC | PRN
  Administered 2021-05-20: 16:00:00 0.5 mg via ORAL
  Filled 2021-05-20 (×3): qty 1

## 2021-05-20 NOTE — Evaluation (Signed)
Physical Therapy Evaluation Patient Details Name: Suzanne Beltran MRN: [id number] DOB: [birth year] Today's Date: 05/20/2021  History of Present Illness  88 yo female that presented to the ED for confusion, found wandering in her yard. Pt admited for hypertensive urgency. Family reports increased difficulties with memory recently. PMH of depression GERD, HTN, CKDIII, dementia.   Clinical Impression  The patient was alert, oriented to self only. Unable to recall orientation by PT at end of session, she was pleasant throughout. She was able to provide some appropriate information about her PLOF as well as her home, confirmed by family. Though some details incorrect (assistance around, neighbors, etc).   The patient was able to perform sit <> Stand without AD, supervision. The patient was able to ambulate ~120ft with no AD, supervision for safety and guidance in hallways. Pt easily re-directable as needed. No LOB, no difficulties with mobility noted. She was able to stand at the sink and brush her teeth with set up assist only. The patient demonstrated and reported near return to baseline level of mobility, no further acute PT needs indicated. PT to sign off. Please reconsult PT if pt status changes or acute needs are identified. TOC and MD updated of PT recommendations including 24/7 supervision/assistance at discharge for safety and discussed with family member as well.         Recommendations for follow up therapy are one component of a multi-disciplinary discharge planning process, led by the attending physician.  Recommendations may be updated based on patient status, additional functional criteria and insurance authorization.  Follow Up Recommendations No PT follow up    Assistance Recommended at Discharge Frequent or constant Supervision/Assistance  Patient can return home with the following  Direct supervision/assist for medications management;Assist for transportation;Assistance with  cooking/housework    Equipment Recommendations None recommended by PT  Recommendations for Other Services       Functional Status Assessment Patient has not had a recent decline in their functional status     Precautions / Restrictions Precautions Precautions: Fall Restrictions Weight Bearing Restrictions: No      Mobility  Bed Mobility               General bed mobility comments: not assessed, pt in recliner pre/post session    Transfers Overall transfer level: Modified independent                      Ambulation/Gait Ambulation/Gait assistance: Supervision, Modified independent (Device/Increase time) Gait Distance (Feet): 160 Feet Assistive device: None         General Gait Details: able to converse throughout mobility, no true LOB noted, good gait velocity  Stairs            Wheelchair Mobility    Modified Rankin (Stroke Patients Only)       Balance Overall balance assessment: No apparent balance deficits (not formally assessed)                                           Pertinent Vitals/Pain Pain Assessment Pain Assessment: No/denies pain    Home Living Family/patient expects to be discharged to:: Private residence Living Arrangements: Alone Available Help at Discharge: Family;Available PRN/intermittently Type of Home: House Home Access: Stairs to enter Entrance Stairs-Rails: Right;Left Entrance Stairs-Number of Steps: 2     Home Equipment: Agricultural consultant (2 wheels) Additional Comments: Family reports  that they want her to use RW, but pt refusing    Prior Function Prior Level of Function : Independent/Modified Independent             Mobility Comments: Pt independent without AD for functional mobility. Family reports that they want her to use RW, but pt refusing ADLs Comments: Pt typically performs ADLs independently (occassional set-up assist for dressing). Pt's family reporting she has been refusing  bathing lately. Family assists with IADLs     Hand Dominance        Extremity/Trunk Assessment   Upper Extremity Assessment Upper Extremity Assessment: Overall WFL for tasks assessed    Lower Extremity Assessment Lower Extremity Assessment: Overall WFL for tasks assessed    Cervical / Trunk Assessment Cervical / Trunk Assessment: Kyphotic  Communication   Communication: No difficulties  Cognition Arousal/Alertness: Awake/alert Behavior During Therapy: WFL for tasks assessed/performed                                   General Comments: Pt A&Ox1 (per chart review, baseline is A&Ox2). pt pleasant throughout with PT        General Comments General comments (skin integrity, edema, etc.): HR 70s during standing grooming tasks and functional mobility    Exercises     Assessment/Plan    PT Assessment Patient does not need any further PT services  PT Problem List Decreased activity tolerance       PT Treatment Interventions      PT Goals (Current goals can be found in the Care Plan section)       Frequency       Co-evaluation               AM-PAC PT "6 Clicks" Mobility  Outcome Measure Help needed turning from your back to your side while in a flat bed without using bedrails?: None Help needed moving from lying on your back to sitting on the side of a flat bed without using bedrails?: None Help needed moving to and from a bed to a chair (including a wheelchair)?: None Help needed standing up from a chair using your arms (e.g., wheelchair or bedside chair)?: None Help needed to walk in hospital room?: None Help needed climbing 3-5 steps with a railing? : None 6 Click Score: 24    End of Session Equipment Utilized During Treatment: Gait belt Activity Tolerance: Patient tolerated treatment well Patient left: in chair;with call bell/phone within reach;with chair alarm set;with family/visitor present Nurse Communication: Mobility status PT  Visit Diagnosis: Other abnormalities of gait and mobility (R26.89)    Time: 1191-4782 PT Time Calculation (min) (ACUTE ONLY): 32 min   Charges:   PT Evaluation $PT Eval Low Complexity: 1 Low PT Treatments $Therapeutic Exercise: 23-37 mins        Olga Coaster PT, DPT 12:39 PM,05/20/21

## 2021-05-20 NOTE — Progress Notes (Signed)
PROGRESS NOTE    Suzanne Beltran  [id number] DOB: [birth year] DOA: 05/19/2021 PCP: Derinda Late, MD    Brief Narrative:   Suzanne Beltran is a 88 y.o. female with medical history significant of hypertension, hyperlipidemia, iron deficiency anemia, GERD, depression, CKD stage IIIa, dementia, who presents with confusion.  Assessment & Plan:   Principal Problem:   Hypertensive urgency Active Problems:   Acute metabolic encephalopathy   Depression   Iron deficiency anemia   Dementia (HCC)   Chronic kidney disease, stage 3a (HCC)   HTN (hypertension)   HLD (hyperlipidemia)  Hypertension urgency. Acute metabolic cephalopathy. Condition has improved today, resume home medicines.  This appeared to be secondary to anxiety from not able to find at home.  Dementia with behavioral disturbance. Upon my examination today, patient seemed to back to baseline.  Chronic kidney disease stage IIIa.  DVT prophylaxis: Lovenox Code Status: full Family Communication: Daughter was updated. Disposition Plan: Patient is medically stable to be discharged, but family could not take patient home, they request admission to memory unit.  Social work is working on it.   Status is: Observation     No intake/output data recorded. Total I/O In: 120 [P.O.:120] Out: -       Subjective: Patient has significant effusion and agitation.  She wandered out of her room, she does not seem to have any weakness. No short of breath or cough.  Objective: Vitals:   05/20/21 0009 05/20/21 0332 05/20/21 1123 05/20/21 1124  BP: (!) 151/70  (!) 198/87 (!) 187/82  Pulse: 74 71 64 64  Resp: 16 18 16 17   Temp: 98.1 F (36.7 C) 98 F (36.7 C) 98.4 F (36.9 C)   TempSrc: Oral Oral Oral   SpO2: 97% 98% 99%   Weight:      Height:        Intake/Output Summary (Last 24 hours) at 05/20/2021 1210 Last data filed at 05/20/2021 0900 Gross per 24 hour  Intake 120 ml  Output --  Net 120 ml   Filed  Weights   05/19/21 1430  Weight: 41.7 kg    Examination:  General exam: Appears calm and comfortable  Respiratory system: Clear to auscultation. Respiratory effort normal. Cardiovascular system: S1 & S2 heard, RRR. No JVD, murmurs, rubs, gallops or clicks. No pedal edema. Gastrointestinal system: Abdomen is nondistended, soft and nontender. No organomegaly or masses felt. Normal bowel sounds heard. Central nervous system: Alert and oriented x1. No focal neurological deficits. Extremities: Symmetric 5 x 5 power. Skin: No rashes, lesions or ulcers Psychiatry: Judgement and insight appear normal. Mood & affect appropriate.     Data Reviewed: I have personally reviewed following labs and imaging studies  CBC: Recent Labs  Lab 05/19/21 1354 05/19/21 1444  WBC 7.1 6.9  NEUTROABS 4.9  --   HGB 12.0 12.0  HCT 38.2 38.4  MCV 96.2 96.0  PLT 336 [id number]   Basic Metabolic Panel: Recent Labs  Lab 05/19/21 1444  NA 137  K 4.0  CL 106  CO2 26  GLUCOSE 123*  BUN 21  CREATININE 1.12*  CALCIUM 8.7*   GFR: Estimated Creatinine Clearance: 20.2 mL/min (A) (by C-G formula based on SCr of 1.12 mg/dL (H)). Liver Function Tests: Recent Labs  Lab 05/19/21 1444  AST 25  ALT 16  ALKPHOS 85  BILITOT 0.3  PROT 6.9  ALBUMIN 3.8   No results for input(s): LIPASE, AMYLASE in the last 168 hours. No results for input(s): AMMONIA in  the last 168 hours. Coagulation Profile: No results for input(s): INR, PROTIME in the last 168 hours. Cardiac Enzymes: No results for input(s): CKTOTAL, CKMB, CKMBINDEX, TROPONINI in the last 168 hours. BNP (last 3 results) No results for input(s): PROBNP in the last 8760 hours. HbA1C: No results for input(s): HGBA1C in the last 72 hours. CBG: Recent Labs  Lab 05/19/21 1430  GLUCAP 133*   Lipid Profile: No results for input(s): CHOL, HDL, LDLCALC, TRIG, CHOLHDL, LDLDIRECT in the last 72 hours. Thyroid Function Tests: Recent Labs    05/19/21 1516   TSH 1.173   Anemia Panel: Recent Labs    05/19/21 1517  VITAMINB12 287   Sepsis Labs: No results for input(s): PROCALCITON, LATICACIDVEN in the last 168 hours.  Recent Results (from the past 240 hour(s))  Resp Panel by RT-PCR (Flu A&B, Covid) Nasopharyngeal Swab     Status: None   Collection Time: 05/19/21  5:27 PM   Specimen: Nasopharyngeal Swab; Nasopharyngeal(NP) swabs in vial transport medium  Result Value Ref Range Status   SARS Coronavirus 2 by RT PCR NEGATIVE NEGATIVE Final    Comment: (NOTE) SARS-CoV-2 target nucleic acids are NOT DETECTED.  The SARS-CoV-2 RNA is generally detectable in upper respiratory specimens during the acute phase of infection. The lowest concentration of SARS-CoV-2 viral copies this assay can detect is 138 copies/mL. A negative result does not preclude SARS-Cov-2 infection and should not be used as the sole basis for treatment or other patient management decisions. A negative result may occur with  improper specimen collection/handling, submission of specimen other than nasopharyngeal swab, presence of viral mutation(s) within the areas targeted by this assay, and inadequate number of viral copies(<138 copies/mL). A negative result must be combined with clinical observations, patient history, and epidemiological information. The expected result is Negative.  Fact Sheet for Patients:  EntrepreneurPulse.com.au  Fact Sheet for Healthcare Providers:  IncredibleEmployment.be  This test is no t yet approved or cleared by the [place] FDA and  has been authorized for detection and/or diagnosis of SARS-CoV-2 by FDA under an Emergency Use Authorization (EUA). This EUA will remain  in effect (meaning this test can be used) for the duration of the COVID-19 declaration under Section 564(b)(1) of the Act, 21 U.S.C.section 360bbb-3(b)(1), unless the authorization is terminated  or revoked sooner.        Influenza A by PCR NEGATIVE NEGATIVE Final   Influenza B by PCR NEGATIVE NEGATIVE Final    Comment: (NOTE) The Xpert Xpress SARS-CoV-2/FLU/RSV plus assay is intended as an aid in the diagnosis of influenza from Nasopharyngeal swab specimens and should not be used as a sole basis for treatment. Nasal washings and aspirates are unacceptable for Xpert Xpress SARS-CoV-2/FLU/RSV testing.  Fact Sheet for Patients: EntrepreneurPulse.com.au  Fact Sheet for Healthcare Providers: IncredibleEmployment.be  This test is not yet approved or cleared by the [place] FDA and has been authorized for detection and/or diagnosis of SARS-CoV-2 by FDA under an Emergency Use Authorization (EUA). This EUA will remain in effect (meaning this test can be used) for the duration of the COVID-19 declaration under Section 564(b)(1) of the Act, 21 U.S.C. section 360bbb-3(b)(1), unless the authorization is terminated or revoked.  Performed at [facility], [address] [place], [place] [address]          Radiology Studies: CT Head Wo Contrast  Result Date: 05/19/2021 CLINICAL DATA:  Altered mid pole status. Patient found wandering in her yard this morning. EXAM: CT HEAD WITHOUT CONTRAST  TECHNIQUE: Contiguous axial images were obtained from the base of the skull through the vertex without intravenous contrast. RADIATION DOSE REDUCTION: This exam was performed according to the departmental dose-optimization program which includes automated exposure control, adjustment of the mA and/or kV according to patient size and/or use of iterative reconstruction technique. COMPARISON:  None. FINDINGS: Brain: No evidence of acute infarction, hemorrhage, hydrocephalus, extra-axial collection or mass lesion/mass effect. There is ventricular and sulcal enlargement consistent with age appropriate volume loss. Patchy areas of white matter hypoattenuation are noted bilaterally  consistent with moderate chronic microvascular ischemic change. Vascular: No hyperdense vessel or unexpected calcification. Skull: Normal. Negative for fracture or focal lesion. Sinuses/Orbits: Globes and orbits are unremarkable. Visualized sinuses are clear. Other: None. IMPRESSION: 1. No acute intracranial abnormalities. Electronically Signed   By: Lajean Manes M.D.   On: 05/19/2021 15:39   DG Chest Port 1 View  Result Date: 05/19/2021 CLINICAL DATA:  Altered mental status. Found in the yard this morning wandering. EXAM: PORTABLE CHEST 1 VIEW COMPARISON:  None. FINDINGS: Cardiac silhouette is normal in size. Moderate size hiatal hernia. No mediastinal or hilar masses. Clear lungs.  No convincing pleural effusion or pneumothorax. Skeletal structures are demineralized, but grossly intact. IMPRESSION: No acute cardiopulmonary disease. Electronically Signed   By: Lajean Manes M.D.   On: 05/19/2021 15:37        Scheduled Meds:  amLODipine  10 mg Oral Daily   atenolol  25 mg Oral Daily   atorvastatin  10 mg Oral Daily   cholecalciferol  1,000 Units Oral Daily   enoxaparin (LOVENOX) injection  30 mg Subcutaneous Q24H   ferrous sulfate  325 mg Oral Q breakfast   hydrALAZINE  25 mg Oral Q8H   lisinopril  40 mg Oral Daily   meloxicam  3.75 mg Oral Daily   mirtazapine  7.5 mg Oral QHS   multivitamin-lutein  1 capsule Oral Daily   pantoprazole  40 mg Oral Daily   sertraline  25 mg Oral Daily   Continuous Infusions:   LOS: 0 days    Time spent: 24 minutes    Sharen Hones, MD [facility]   To contact the attending provider between 7A-7P or the covering provider during after hours 7P-7A, please log into the web site [email or web address] and access using universal [place] password for that web site. If you do not have the password, please call the hospital operator.  05/20/2021, 12:10 PM

## 2021-05-20 NOTE — NC FL2 (Signed)
[place] LEVEL OF CARE SCREENING TOOL     IDENTIFICATION  Patient Name: Suzanne Beltran Birthdate: 1924/09/14 Sex: female Admission Date (Current Location): 05/19/2021  [facility] and [place] Number:  Engineering geologist and Address:  [facility], [address], [place], [place] [address]      Provider Number: [id number]  Attending Physician Name and Address:  Sharen Hones, MD  Relative Name and Phone Number:  Alda Berthold [phone]    Current Level of Care: Hospital Recommended Level of Care: [place], Memory Care Prior Approval Number:    Date Approved/Denied:   PASRR Number:    Discharge Plan:      Current Diagnoses: Patient Active Problem List   Diagnosis Date Noted   Hypertensive urgency [id number]   Acute metabolic encephalopathy [id number]   GERD (gastroesophageal reflux disease) 05/19/2021   Depression 05/19/2021   Iron deficiency anemia 05/19/2021   Dementia ([place]) 05/19/2021   Chronic kidney disease, stage 3a ([place]) 05/19/2021   Memory deficit 05/19/2021   HLD (hyperlipidemia) 05/19/2021   HTN (hypertension)     Orientation RESPIRATION BLADDER Height & Weight     Self  Normal Continent Weight: 91 lb 14.9 oz (41.7 kg) Height:  4\' 11"  (149.9 cm)  BEHAVIORAL SYMPTOMS/MOOD NEUROLOGICAL BOWEL NUTRITION STATUS  Wanderer     Diet (heart; thin liquids)  AMBULATORY STATUS COMMUNICATION OF NEEDS Skin   Supervision Verbally Normal                       Personal Care Assistance Level of Assistance  Bathing, Feeding, Dressing Bathing Assistance: Limited assistance Feeding assistance: Limited assistance Dressing Assistance: Limited assistance     Functional Limitations Info             SPECIAL CARE FACTORS FREQUENCY                       Contractures      Additional Factors Info  Code Status, Allergies Code Status Info: full Allergies Info: nka           Current  Medications (05/20/2021):  This is the current hospital active medication list Current Facility-Administered Medications  Medication Dose Route Frequency Provider Last Rate Last Admin   acetaminophen (TYLENOL) tablet 650 mg  650 mg Oral Q6H PRN Ivor Costa, MD   650 mg at 05/20/21 0451   amLODipine (NORVASC) tablet 10 mg  10 mg Oral Daily Ivor Costa, MD   10 mg at 05/20/21 0836   atenolol (TENORMIN) tablet 25 mg  25 mg Oral Daily Ivor Costa, MD   25 mg at 05/20/21 0835   atorvastatin (LIPITOR) tablet 10 mg  10 mg Oral Daily Ivor Costa, MD   10 mg at 05/20/21 [id number]   cholecalciferol (VITAMIN D3) tablet 1,000 Units  1,000 Units Oral Daily Ivor Costa, MD   1,000 Units at 05/20/21 0835   enoxaparin (LOVENOX) injection 30 mg  30 mg Subcutaneous Q24H Ivor Costa, MD       ferrous sulfate tablet 325 mg  325 mg Oral Q breakfast Ivor Costa, MD   325 mg at 05/20/21 0836   hydrALAZINE (APRESOLINE) injection 5 mg  5 mg Intravenous Q2H PRN Ivor Costa, MD   5 mg at 05/19/21 1728   hydrALAZINE (APRESOLINE) tablet 25 mg  25 mg Oral Q8H Ivor Costa, MD   25 mg at 05/20/21 0612   lisinopril (ZESTRIL) tablet 40 mg  40  mg Oral Daily Ivor Costa, MD   40 mg at 05/20/21 0836   loratadine (CLARITIN) tablet 10 mg  10 mg Oral Daily PRN Ivor Costa, MD       meloxicam [facility]) tablet 3.75 mg  3.75 mg Oral Daily Ivor Costa, MD   3.75 mg at 05/20/21 [id number]   mirtazapine (REMERON) tablet 7.5 mg  7.5 mg Oral QHS Ivor Costa, MD       multivitamin-lutein (OCUVITE-LUTEIN) capsule 1 capsule  1 capsule Oral Daily Ivor Costa, MD   1 capsule at 05/20/21 0837   ondansetron (ZOFRAN) injection 4 mg  4 mg Intravenous Q8H PRN Ivor Costa, MD       pantoprazole (PROTONIX) EC tablet 40 mg  40 mg Oral Daily Ivor Costa, MD   40 mg at 05/20/21 0836   sertraline (ZOLOFT) tablet 25 mg  25 mg Oral Daily Ivor Costa, MD   25 mg at 05/20/21 [id number]     Discharge Medications: Please see discharge summary for a list of discharge medications.  Relevant  Imaging Results:  Relevant Lab Results:   Additional Information SS #: [place], LCSW

## 2021-05-20 NOTE — Evaluation (Signed)
Occupational Therapy Evaluation Patient Details Name: Suzanne Beltran MRN: [id number] DOB: 09-09-1924 Today's Date: 05/20/2021   History of Present Illness 88 yo female that presented to the ED for confusion, found wandering in her yard. Pt admited for hypertensive urgency. Family reports increased difficulties with memory recently. PMH of depression GERD, HTN, CKDIII, dementia   Clinical Impression   Pt seen for OT evaluation this date. Upon arrival to room, pt awake and seated upright in recliner with daughters and son-in-law present. Pt A&Ox1 (per chart review, pt's baseline is A&Ox2). Pt agitated during session (verbalizing wanting to return home and verbalizing anger towards family), only re-directable by this author for brief moments at a time. PLOF obtained from family at bedside; prior to hospital admission, pt was typically independent in ADLs and functional mobility (occasionally requiring set-up assistance/cues for participating in ADLs), receiving assistance from family for IADLs. Pt performed standing grooming tasks (e.g., washing face & hands) and functional mobility, requiring SUPERVISION only d/t impulsivity. Pt refused to participate in additional ADLs this date. Pt currently demonstrates baseline independence to perform ADLs and mobility tasks. No skilled OT needs identified at this time. Will sign off. Please re-consult if additional OT needs arise.      Recommendations for follow up therapy are one component of a multi-disciplinary discharge planning process, led by the attending physician.  Recommendations may be updated based on patient status, additional functional criteria and insurance authorization.   Follow Up Recommendations  No OT follow up    Assistance Recommended at Discharge Frequent or constant Supervision/Assistance  Patient can return home with the following Assistance with cooking/housework;Direct supervision/assist for medications management    Functional  Status Assessment  Patient has not had a recent decline in their functional status  Equipment Recommendations  None recommended by OT       Precautions / Restrictions Precautions Precautions: Fall Restrictions Weight Bearing Restrictions: No      Mobility Bed Mobility               General bed mobility comments: not assessed, pt in recliner pre/post session    Transfers Overall transfer level: Modified independent Equipment used: Rolling walker (2 wheels)               General transfer comment: Able to perform with use, using armrest to push up and clear hips from chair      Balance Overall balance assessment: Modified Independent                                         ADL either performed or assessed with clinical judgement   ADL Overall ADL's : Needs assistance/impaired                                       General ADL Comments: Pt performs standing grooming tasks (e.g., washing face & hands) and functional mobility, requiring SUPERVISION d/t impulsivity. Pt refuses to participate in additional ADLs     Vision Baseline Vision/History: 1 Wears glasses Ability to See in Adequate Light: 0 Adequate              Pertinent Vitals/Pain Pain Assessment Pain Assessment: No/denies pain        Extremity/Trunk Assessment Upper Extremity Assessment Upper Extremity Assessment: Overall WFL for tasks assessed  Lower Extremity Assessment Lower Extremity Assessment: Overall WFL for tasks assessed       Communication Communication Communication: No difficulties   Cognition Arousal/Alertness: Awake/alert Behavior During Therapy: Agitated, Impulsive Overall Cognitive Status: Impaired/Different from baseline                                 General Comments: Pt A&Ox1 (per chart review, baseline is A&Ox2). Pt agitated during session (verbalizing wanting to return home and anger towards family), only  re-directable for brief moments at a time     General Comments  HR 70s during standing grooming tasks and functional mobility            Home Living Family/patient expects to be discharged to:: Assisted living Living Arrangements: Children                               Additional Comments: Family reports that they want her to use RW, but pt refusing      Prior Functioning/Environment Prior Level of Function : Independent/Modified Independent             Mobility Comments: Pt independent without AD for functional mobility. Family reports that they want her to use RW, but pt refusing ADLs Comments: Pt typically performs ADLs independently (occassional set-up assist for dressing). Pt's family reporting she has been refusing bathing lately. Family assists with IADLs        OT Problem List: Decreased cognition;Decreased safety awareness;Decreased knowledge of precautions      OT Treatment/Interventions:      OT Goals(Current goals can be found in the care plan section) Acute Rehab OT Goals Patient Stated Goal: to return home OT Goal Formulation: With patient Time For Goal Achievement: 06/03/21 Potential to Achieve Goals: Good  OT Frequency:         AM-PAC OT "6 Clicks" Daily Activity     Outcome Measure Help from another person eating meals?: None Help from another person taking care of personal grooming?: None Help from another person toileting, which includes using toliet, bedpan, or urinal?: None Help from another person bathing (including washing, rinsing, drying)?: A Little Help from another person to put on and taking off regular upper body clothing?: None Help from another person to put on and taking off regular lower body clothing?: A Little 6 Click Score: 22   End of Session Nurse Communication: Mobility status  Activity Tolerance: Treatment limited secondary to agitation Patient left: in chair;with call bell/phone within reach;with chair  alarm set;with family/visitor present  OT Visit Diagnosis: Other symptoms and signs involving cognitive function                Time: 7673-4193 OT Time Calculation (min): 13 min Charges:  OT General Charges $OT Visit: 1 Visit OT Evaluation $OT Eval Moderate Complexity: 1 Mod  Matthew Folks, OTR/L ASCOM [phone]

## 2021-05-20 NOTE — TOC Initial Note (Addendum)
Transition of Care [facility]) - Initial/Assessment Note    Patient Details  Name: Suzanne Beltran MRN: [id number] Date of Birth: [birth year]  [facility] CM/SW Contact:    Magnus Ivan, LCSW Phone Number: 05/20/2021, 11:28 AM  Clinical Narrative:                Patient from home, presents with confusion and wandering behaviors. PT/OT evals pending.  CSW met with daughters and son in law to discuss options. They are wanting patient to be placed in [place].  Provided list of Memory Care ALFs. Explained  placement process as well as Care [employer] and provided [employer]. They are agreeable to referral being made to [facility] with [facility] for guidance for placement.  They are aware of options for Medicaid (not sure if patient would qualify) and that otherwise it would be private pay. They do not feel safe taking patient back home due to her wandering, feels she needs to leave the hospital and go to Memory Care. Referral made to [facility] with [facility] who stated she will reach out to Alda Berthold (son in law) today per family's request.   FL2 completed and copy printed for daughter per her request.    Expected Discharge Plan: Memory Care Barriers to Discharge: Continued Medical Work up, Unsafe home situation   Patient Goals and CMS Choice Patient states their goals for this hospitalization and ongoing recovery are:: ALF memory care placement CMS Medicare.gov Compare Post Acute Care list provided to:: Patient Represenative (must comment) Choice offered to / list presented to : Adult Children  Expected Discharge Plan and Services Expected Discharge Plan: Memory Care       Living arrangements for the past 2 months: [place]                                      Prior Living Arrangements/Services Living arrangements for the past 2 months: Single Family Home   Patient language and need for interpreter reviewed:: Yes Do you  feel safe going back to the place where you live?: No   family feels patient unsafe due to wandering  Need for Family Participation in Patient Care: Yes (Comment) Care giver support system in place?: Yes (comment)   Criminal Activity/Legal Involvement Pertinent to Current Situation/Hospitalization: No - Comment as needed  Activities of Daily Living Home Assistive Devices/Equipment: None ADL Screening (condition at time of admission) Patient's cognitive ability adequate to safely complete daily activities?: Yes Is the patient deaf or have difficulty hearing?: No Does the patient have difficulty seeing, even when wearing glasses/contacts?: No Does the patient have difficulty concentrating, remembering, or making decisions?: Yes Patient able to express need for assistance with ADLs?: Yes Does the patient have difficulty dressing or bathing?: Yes Independently performs ADLs?: No Communication: Independent Does the patient have difficulty walking or climbing stairs?: Yes Weakness of Legs: Both Weakness of Arms/Hands: None  Permission Sought/Granted Permission sought to share information with : Chartered certified accountant granted to share information with : Yes, Verbal Permission Granted     Permission granted to share info w AGENCY: [facility], ALFs        Emotional Assessment       Orientation: : Fluctuating Orientation (Suspected and/or reported Sundowners) Alcohol / Substance Use: Not Applicable Psych Involvement: No (comment)  Admission diagnosis:  Hypertensive encephalopathy [I67.4] HTN (hypertension) [  I10] Hypertensive urgency [I16.0] Altered mental status, unspecified altered mental status type [R41.82] Patient Active Problem List   Diagnosis Date Noted   Hypertensive urgency 74/25/9563   Acute metabolic encephalopathy 87/56/4332   GERD (gastroesophageal reflux disease) 05/19/2021   Depression 05/19/2021   Iron deficiency anemia 05/19/2021   Dementia  ([place]) 05/19/2021   Chronic kidney disease, stage 3a ([place]) 05/19/2021   Memory deficit 05/19/2021   HLD (hyperlipidemia) 05/19/2021   HTN (hypertension)    PCP:  Derinda Late, MD Pharmacy:   [id number] [place], [place]Phone: [phone]: [phone] [facility] [address] [place] [place]Phone: [phone]: [phone]    Social Determinants of Health (SDOH) Interventions    Readmission Risk Interventions No flowsheet data found.

## 2021-05-21 DIAGNOSIS — G301 Alzheimer's disease with late onset: Secondary | ICD-10-CM | POA: Diagnosis not present

## 2021-05-21 DIAGNOSIS — I160 Hypertensive urgency: Secondary | ICD-10-CM | POA: Diagnosis not present

## 2021-05-21 DIAGNOSIS — G9341 Metabolic encephalopathy: Secondary | ICD-10-CM | POA: Diagnosis not present

## 2021-05-21 DIAGNOSIS — F02C11 Dementia in other diseases classified elsewhere, severe, with agitation: Secondary | ICD-10-CM | POA: Diagnosis not present

## 2021-05-21 LAB — FOLATE RBC
Folate, Hemolysate: 529 ng/mL
Folate, RBC: 1533 ng/mL (ref >498)
Hematocrit: 34.5 % (ref 34.0–46.6)

## 2021-05-21 MED ORDER — ENSURE ENLIVE PO LIQD
237.0000 mL | Freq: Two times a day (BID) | ORAL | Status: DC
  Administered 2021-05-22 – 2021-05-25 (×6): 237 mL via ORAL

## 2021-05-21 MED ORDER — ADULT MULTIVITAMIN W/MINERALS CH
1.0000 | ORAL_TABLET | Freq: Every day | ORAL | Status: DC
  Administered 2021-05-22 – 2021-05-25 (×4): 1 via ORAL
  Filled 2021-05-21 (×4): qty 1

## 2021-05-21 MED ORDER — LACTULOSE 10 GM/15ML PO SOLN
20.0000 g | Freq: Once | ORAL | Status: AC
  Administered 2021-05-21: 20 g via ORAL
  Filled 2021-05-21: qty 30

## 2021-05-21 MED ORDER — SENNOSIDES-DOCUSATE SODIUM 8.6-50 MG PO TABS
2.0000 | ORAL_TABLET | Freq: Two times a day (BID) | ORAL | Status: DC
  Administered 2021-05-21 – 2021-05-23 (×5): 2 via ORAL
  Filled 2021-05-21 (×4): qty 2

## 2021-05-21 NOTE — Progress Notes (Signed)
PROGRESS NOTE    Suzanne Beltran  [id number] DOB: 12/14/1924 DOA: 05/19/2021 PCP: Derinda Late, MD   Chief complaint.  Confusion. Brief Narrative:  Suzanne Beltran is a 88 y.o. female with medical history significant of hypertension, hyperlipidemia, iron deficiency anemia, GERD, depression, CKD stage IIIa, dementia, who presents with confusion.   Assessment & Plan:   Principal Problem:   Hypertensive urgency Active Problems:   Acute metabolic encephalopathy   Depression   Iron deficiency anemia   Dementia (HCC)   Chronic kidney disease, stage 3a (HCC)   HTN (hypertension)   HLD (hyperlipidemia)   Hypertension urgency. Acute metabolic cephalopathy. Patient condition has back to baseline, currently pending placement to memory unit.  Family cannot take care of patient at home.  Dementia with behavioral disturbance. Continue as needed Haldol.  Chronic kidney disease stage IIIa.   DVT prophylaxis: Lovenox Code Status: full Family Communication:  Disposition Plan: Pending placement     Status is: Observation       I/O last 3 completed shifts: In: 600 [P.O.:600] Out: -  No intake/output data recorded.    Subjective: Spoke with sitter, patient slept well last night.  She has a confusion, she is not in pain.  Objective: Vitals:   05/20/21 1954 05/21/21 0006 05/21/21 0408 05/21/21 0618  BP: (!) 146/60 137/63 (!) 181/75 (!) 141/66  Pulse: 66 65 66 66  Resp: 17 16 16 16   Temp: 98.1 F (36.7 C) 97.9 F (36.6 C) 98.4 F (36.9 C) 98.5 F (36.9 C)  TempSrc: Oral Oral Oral Oral  SpO2: 97% 94% 96% 95%  Weight:      Height:        Intake/Output Summary (Last 24 hours) at 05/21/2021 1000 Last data filed at 05/20/2021 1700 Gross per 24 hour  Intake 480 ml  Output --  Net 480 ml   Filed Weights   05/19/21 1430  Weight: 41.7 kg    Examination:  General exam: Appears calm and comfortable  Respiratory system: Clear to auscultation. Respiratory effort  normal. Cardiovascular system: S1 & S2 heard, RRR. No JVD, murmurs, rubs, gallops or clicks. No pedal edema. Gastrointestinal system: Abdomen is nondistended, soft and nontender. No organomegaly or masses felt. Normal bowel sounds heard. Central nervous system: Alert and oriented x1. No focal neurological deficits. Extremities: Symmetric 5 x 5 power. Skin: No rashes, lesions or ulcers    Data Reviewed: I have personally reviewed following labs and imaging studies  CBC: Recent Labs  Lab 05/19/21 1354 05/19/21 1444  WBC 7.1 6.9  NEUTROABS 4.9  --   HGB 12.0 12.0  HCT 38.2 38.4  MCV 96.2 96.0  PLT 336 [id number]   Basic Metabolic Panel: Recent Labs  Lab 05/19/21 1444  NA 137  K 4.0  CL 106  CO2 26  GLUCOSE 123*  BUN 21  CREATININE 1.12*  CALCIUM 8.7*   GFR: Estimated Creatinine Clearance: 20.2 mL/min (A) (by C-G formula based on SCr of 1.12 mg/dL (H)). Liver Function Tests: Recent Labs  Lab 05/19/21 1444  AST 25  ALT 16  ALKPHOS 85  BILITOT 0.3  PROT 6.9  ALBUMIN 3.8   No results for input(s): LIPASE, AMYLASE in the last 168 hours. No results for input(s): AMMONIA in the last 168 hours. Coagulation Profile: No results for input(s): INR, PROTIME in the last 168 hours. Cardiac Enzymes: No results for input(s): CKTOTAL, CKMB, CKMBINDEX, TROPONINI in the last 168 hours. BNP (last 3 results) No results for input(s): PROBNP  in the last 8760 hours. HbA1C: No results for input(s): HGBA1C in the last 72 hours. CBG: Recent Labs  Lab 05/19/21 1430  GLUCAP 133*   Lipid Profile: No results for input(s): CHOL, HDL, LDLCALC, TRIG, CHOLHDL, LDLDIRECT in the last 72 hours. Thyroid Function Tests: Recent Labs    05/19/21 1516  TSH 1.173   Anemia Panel: Recent Labs    05/19/21 1517  VITAMINB12 287   Sepsis Labs: No results for input(s): PROCALCITON, LATICACIDVEN in the last 168 hours.  Recent Results (from the past 240 hour(s))  Resp Panel by RT-PCR (Flu A&B,  Covid) Nasopharyngeal Swab     Status: None   Collection Time: 05/19/21  5:27 PM   Specimen: Nasopharyngeal Swab; Nasopharyngeal(NP) swabs in vial transport medium  Result Value Ref Range Status   SARS Coronavirus 2 by RT PCR NEGATIVE NEGATIVE Final    Comment: (NOTE) SARS-CoV-2 target nucleic acids are NOT DETECTED.  The SARS-CoV-2 RNA is generally detectable in upper respiratory specimens during the acute phase of infection. The lowest concentration of SARS-CoV-2 viral copies this assay can detect is 138 copies/mL. A negative result does not preclude SARS-Cov-2 infection and should not be used as the sole basis for treatment or other patient management decisions. A negative result may occur with  improper specimen collection/handling, submission of specimen other than nasopharyngeal swab, presence of viral mutation(s) within the areas targeted by this assay, and inadequate number of viral copies(<138 copies/mL). A negative result must be combined with clinical observations, patient history, and epidemiological information. The expected result is Negative.  Fact Sheet for Patients:  BloggerCourse.com  Fact Sheet for Healthcare Providers:  SeriousBroker.it  This test is no t yet approved or cleared by the [place] FDA and  has been authorized for detection and/or diagnosis of SARS-CoV-2 by FDA under an Emergency Use Authorization (EUA). This EUA will remain  in effect (meaning this test can be used) for the duration of the COVID-19 declaration under Section 564(b)(1) of the Act, 21 U.S.C.section 360bbb-3(b)(1), unless the authorization is terminated  or revoked sooner.       Influenza A by PCR NEGATIVE NEGATIVE Final   Influenza B by PCR NEGATIVE NEGATIVE Final    Comment: (NOTE) The Xpert Xpress SARS-CoV-2/FLU/RSV plus assay is intended as an aid in the diagnosis of influenza from Nasopharyngeal swab specimens and should  not be used as a sole basis for treatment. Nasal washings and aspirates are unacceptable for Xpert Xpress SARS-CoV-2/FLU/RSV testing.  Fact Sheet for Patients: BloggerCourse.com  Fact Sheet for Healthcare Providers: SeriousBroker.it  This test is not yet approved or cleared by the [place] FDA and has been authorized for detection and/or diagnosis of SARS-CoV-2 by FDA under an Emergency Use Authorization (EUA). This EUA will remain in effect (meaning this test can be used) for the duration of the COVID-19 declaration under Section 564(b)(1) of the Act, 21 U.S.C. section 360bbb-3(b)(1), unless the authorization is terminated or revoked.  Performed at [facility], [address] [place], [place] [address]          Radiology Studies: CT Head Wo Contrast  Result Date: 05/19/2021 CLINICAL DATA:  Altered mid pole status. Patient found wandering in her yard this morning. EXAM: CT HEAD WITHOUT CONTRAST TECHNIQUE: Contiguous axial images were obtained from the base of the skull through the vertex without intravenous contrast. RADIATION DOSE REDUCTION: This exam was performed according to the departmental dose-optimization program which includes automated exposure control, adjustment of the mA and/or  kV according to patient size and/or use of iterative reconstruction technique. COMPARISON:  None. FINDINGS: Brain: No evidence of acute infarction, hemorrhage, hydrocephalus, extra-axial collection or mass lesion/mass effect. There is ventricular and sulcal enlargement consistent with age appropriate volume loss. Patchy areas of white matter hypoattenuation are noted bilaterally consistent with moderate chronic microvascular ischemic change. Vascular: No hyperdense vessel or unexpected calcification. Skull: Normal. Negative for fracture or focal lesion. Sinuses/Orbits: Globes and orbits are unremarkable. Visualized sinuses are clear.  Other: None. IMPRESSION: 1. No acute intracranial abnormalities. Electronically Signed   By: Lajean Manes M.D.   On: 05/19/2021 15:39   DG Chest Port 1 View  Result Date: 05/19/2021 CLINICAL DATA:  Altered mental status. Found in the yard this morning wandering. EXAM: PORTABLE CHEST 1 VIEW COMPARISON:  None. FINDINGS: Cardiac silhouette is normal in size. Moderate size hiatal hernia. No mediastinal or hilar masses. Clear lungs.  No convincing pleural effusion or pneumothorax. Skeletal structures are demineralized, but grossly intact. IMPRESSION: No acute cardiopulmonary disease. Electronically Signed   By: Lajean Manes M.D.   On: 05/19/2021 15:37        Scheduled Meds:  amLODipine  10 mg Oral Daily   atenolol  25 mg Oral Daily   atorvastatin  10 mg Oral Daily   cholecalciferol  1,000 Units Oral Daily   enoxaparin (LOVENOX) injection  30 mg Subcutaneous Q24H   ferrous sulfate  325 mg Oral Q breakfast   hydrALAZINE  25 mg Oral Q8H   lisinopril  40 mg Oral Daily   meloxicam  3.75 mg Oral Daily   mirtazapine  7.5 mg Oral QHS   multivitamin-lutein  1 capsule Oral Daily   pantoprazole  40 mg Oral Daily   sertraline  25 mg Oral Daily   Continuous Infusions:   LOS: 1 day    Time spent: 25 minutes    Sharen Hones, MD [facility]   To contact the attending provider between 7A-7P or the covering provider during after hours 7P-7A, please log into the web site [email or web address] and access using universal [place] password for that web site. If you do not have the password, please call the hospital operator.  05/21/2021, 10:00 AM

## 2021-05-21 NOTE — Progress Notes (Signed)
Initial Nutrition Assessment  DOCUMENTATION CODES:   Not applicable  INTERVENTION:   -Ensure Enlive po BID, each supplement provides 350 kcal and 20 grams of protein.  -MVI with minerals daily -Liberalize diet to regular  NUTRITION DIAGNOSIS:   Predicted suboptimal nutrient intake related to chronic illness (dementia) as evidenced by estimated needs.  GOAL:   Patient will meet greater than or equal to 90% of their needs  MONITOR:   PO intake, Supplement acceptance, Diet advancement, Labs, Weight trends, Skin, I & O's  REASON FOR ASSESSMENT:   Malnutrition Screening Tool    ASSESSMENT:   Suzanne Beltran is a 88 y.o. female with medical history significant of hypertension, hyperlipidemia, iron deficiency anemia, GERD, depression, CKD stage IIIa, dementia, who presents with confusion.  Pt admitted with hypertensive urgency and acute metabolic encephalopathy.   Reviewed I/O's: +600 ml x 24 hours   Case discussed with sitter, who reports that pt has been sleeping most of the morning. Pt awoke easily when name was called. She reports that she did not eat breakfast because he was sleepy. Noted breakfast tray was at bedside, untouched. Meal completions 25-75%.  Pt reports that she has always been small framed. Per pt, she "eats all the time" at home, but also states that she is not a big eater. Pork chops are her favorite food. She denies ay difficulty chewing or swallowing foods.   Pt unsure of UBW, but denies weight loss. Reviewed wt hx; wt has been stable over the past 9 months.   Discussed importance of good meal and supplement intake to promote healing. RD will liberalize diet from Heart Healthy to regular for widest variety of meal selections.   Medications reviewed and include vitamin D3 and ferrous sulfate.   Labs reviewed: Na: 133.    NUTRITION - FOCUSED PHYSICAL EXAM:  Flowsheet Row Most Recent Value  Orbital Region No depletion  Upper Arm Region Mild depletion   Thoracic and Lumbar Region No depletion  Buccal Region No depletion  Temple Region No depletion  Clavicle Bone Region No depletion  Clavicle and Acromion Bone Region No depletion  Scapular Bone Region No depletion  Dorsal Hand Mild depletion  Patellar Region Mild depletion  Anterior Thigh Region Mild depletion  Posterior Calf Region Mild depletion  Edema (RD Assessment) None  Hair Reviewed  Eyes Reviewed  Mouth Reviewed  Skin Reviewed  Nails Reviewed       Diet Order:   Diet Order             Diet regular Room service appropriate? Yes; Fluid consistency: Thin  Diet effective now                   EDUCATION NEEDS:   Education needs have been addressed  Skin:  Skin Assessment: Reviewed RN Assessment  Last BM:  Unknown  Height:   Ht Readings from Last 1 Encounters:  05/19/21 4\' 11"  (1.499 m)    Weight:   Wt Readings from Last 1 Encounters:  05/19/21 41.7 kg    Ideal Body Weight:  44.7 kg  BMI:  Body mass index is 18.57 kg/m.  Estimated Nutritional Needs:   Kcal:  1250-1450  Protein:  55-70 grams  Fluid:  > 1.2 L    Loistine Chance, RD, LDN, [place] Registered Dietitian II Certified Diabetes Care and Education Specialist Please refer to [facility] for RD and/or RD on-call/weekend/after hours pager

## 2021-05-21 NOTE — TOC Progression Note (Signed)
Transition of Care [facility]) - Progression Note    Patient Details  Name: Suzanne Beltran MRN: [id number] Date of Birth: [birth year]  Transition of Care [facility]) CM/SW Contact  Eileen Stanford, LCSW Phone Number: 05/21/2021, 3:30 PM  Clinical Narrative:   Per Andee Poles with [facility] family will be touring memory care units tomorrow.    Expected Discharge Plan: Memory Care Barriers to Discharge: Continued Medical Work up, Unsafe home situation  Expected Discharge Plan and Services Expected Discharge Plan: Memory Care       Living arrangements for the past 2 months: Single Family Home                                       Social Determinants of Health (SDOH) Interventions    Readmission Risk Interventions No flowsheet data found.

## 2021-05-22 DIAGNOSIS — I160 Hypertensive urgency: Secondary | ICD-10-CM | POA: Diagnosis not present

## 2021-05-22 DIAGNOSIS — G9341 Metabolic encephalopathy: Secondary | ICD-10-CM | POA: Diagnosis not present

## 2021-05-22 DIAGNOSIS — G301 Alzheimer's disease with late onset: Secondary | ICD-10-CM | POA: Diagnosis not present

## 2021-05-22 DIAGNOSIS — F02C11 Dementia in other diseases classified elsewhere, severe, with agitation: Secondary | ICD-10-CM | POA: Diagnosis not present

## 2021-05-22 MED ORDER — SUCRALFATE 1 GM/10ML PO SUSP
1.0000 g | Freq: Three times a day (TID) | ORAL | Status: DC
  Administered 2021-05-22 – 2021-05-25 (×15): 1 g via ORAL
  Filled 2021-05-22 (×15): qty 10

## 2021-05-22 MED ORDER — SIMETHICONE 80 MG PO CHEW
80.0000 mg | CHEWABLE_TABLET | Freq: Once | ORAL | Status: AC
  Administered 2021-05-22: 80 mg via ORAL
  Filled 2021-05-22: qty 1

## 2021-05-22 NOTE — Progress Notes (Signed)
PROGRESS NOTE    Suzanne Beltran  Beltran DOB: [birth year] DOA: 05/19/2021 PCP: Derinda Late, MD   Chief complaint.  Altered mental status. Brief Narrative:   Suzanne Beltran is a 88 y.o. female with medical history significant of hypertension, hyperlipidemia, iron deficiency anemia, GERD, depression, CKD stage IIIa, dementia, who presents with confusion.  Assessment & Plan:   Principal Problem:   Hypertensive urgency Active Problems:   Acute metabolic encephalopathy   Depression   Iron deficiency anemia   Dementia (HCC)   Chronic kidney disease, stage 3a (HCC)   HTN (hypertension)   HLD (hyperlipidemia)   Hypertension urgency. Acute metabolic cephalopathy. Blood pressure much better, continue current treatment.  Dementia with behavioral disturbance. Patient is receiving as needed Haldol. She is nauseous since yesterday, she had a bowel movement last night after a stool softener, will continue Protonix, added sucralfate.   Chronic kidney disease stage IIIa.   DVT prophylaxis: Lovenox Code Status: full Family Communication:  Disposition Plan: Pending placement     Status is: Inpatient.  Pending placement.     I/O last 3 completed shifts: In: 480 [P.O.:480] Out: -  No intake/output data recorded.     Subjective: Patient was nauseous since yesterday, vomited 1 time yesterday afternoon.  She was constipated, received a dose of lactulose yesterday, had a bowel movement last night. Denies any short of breath or cough.  Objective: Vitals:   05/21/21 2329 05/22/21 0641 05/22/21 0825 05/22/21 1227  BP: (!) 159/53 (!) 156/62 134/64 (!) 112/58  Pulse: (!) 58 66 61 64  Resp: 15 20 16 17   Temp: 98.3 F (36.8 C) 98.7 F (37.1 C) 98.5 F (36.9 C) 98.4 F (36.9 C)  TempSrc: Oral Oral Oral Oral  SpO2: 96% 96% 97% 97%  Weight:      Height:        Intake/Output Summary (Last 24 hours) at 05/22/2021 1244 Last data filed at 05/21/2021 1841 Gross per 24  hour  Intake 480 ml  Output --  Net 480 ml   Filed Weights   05/19/21 1430  Weight: 41.7 kg    Examination:  General exam: Appears calm and comfortable  Respiratory system: Clear to auscultation. Respiratory effort normal. Cardiovascular system: S1 & S2 heard, RRR. No JVD, murmurs, rubs, gallops or clicks. No pedal edema. Gastrointestinal system: Abdomen is nondistended, soft and nontender. No organomegaly or masses felt. Normal bowel sounds heard. Central nervous system: Alert and oriented x1. No focal neurological deficits. Extremities: Symmetric 5 x 5 power. Skin: No rashes, lesions or ulcers     Data Reviewed: I have personally reviewed following labs and imaging studies  CBC: Recent Labs  Lab 05/19/21 1354 05/19/21 1444 05/20/21 0623  WBC 7.1 6.9  --   NEUTROABS 4.9  --   --   HGB 12.0 12.0  --   HCT 38.2 38.4 34.5  MCV 96.2 96.0  --   PLT 336 322  --    Basic Metabolic Panel: Recent Labs  Lab 05/19/21 1444  NA 137  K 4.0  CL 106  CO2 26  GLUCOSE 123*  BUN 21  CREATININE 1.12*  CALCIUM 8.7*   GFR: Estimated Creatinine Clearance: 20.2 mL/min (A) (by C-G formula based on SCr of 1.12 mg/dL (H)). Liver Function Tests: Recent Labs  Lab 05/19/21 1444  AST 25  ALT 16  ALKPHOS 85  BILITOT 0.3  PROT 6.9  ALBUMIN 3.8   No results for input(s): LIPASE, AMYLASE in the last  168 hours. No results for input(s): AMMONIA in the last 168 hours. Coagulation Profile: No results for input(s): INR, PROTIME in the last 168 hours. Cardiac Enzymes: No results for input(s): CKTOTAL, CKMB, CKMBINDEX, TROPONINI in the last 168 hours. BNP (last 3 results) No results for input(s): PROBNP in the last 8760 hours. HbA1C: No results for input(s): HGBA1C in the last 72 hours. CBG: Recent Labs  Lab 05/19/21 1430  GLUCAP 133*   Lipid Profile: No results for input(s): CHOL, HDL, LDLCALC, TRIG, CHOLHDL, LDLDIRECT in the last 72 hours. Thyroid Function Tests: Recent  Labs    05/19/21 1516  TSH 1.173   Anemia Panel: Recent Labs    05/19/21 1517  VITAMINB12 287   Sepsis Labs: No results for input(s): PROCALCITON, LATICACIDVEN in the last 168 hours.  Recent Results (from the past 240 hour(s))  Resp Panel by RT-PCR (Flu A&B, Covid) Nasopharyngeal Swab     Status: None   Collection Time: 05/19/21  5:27 PM   Specimen: Nasopharyngeal Swab; Nasopharyngeal(NP) swabs in vial transport medium  Result Value Ref Range Status   SARS Coronavirus 2 by RT PCR NEGATIVE NEGATIVE Final    Comment: (NOTE) SARS-CoV-2 target nucleic acids are NOT DETECTED.  The SARS-CoV-2 RNA is generally detectable in upper respiratory specimens during the acute phase of infection. The lowest concentration of SARS-CoV-2 viral copies this assay can detect is 138 copies/mL. A negative result does not preclude SARS-Cov-2 infection and should not be used as the sole basis for treatment or other patient management decisions. A negative result may occur with  improper specimen collection/handling, submission of specimen other than nasopharyngeal swab, presence of viral mutation(s) within the areas targeted by this assay, and inadequate number of viral copies(<138 copies/mL). A negative result must be combined with clinical observations, patient history, and epidemiological information. The expected result is Negative.  Fact Sheet for Patients:  EntrepreneurPulse.com.au  Fact Sheet for Healthcare Providers:  IncredibleEmployment.be  This test is no t yet approved or cleared by the [place] FDA and  has been authorized for detection and/or diagnosis of SARS-CoV-2 by FDA under an Emergency Use Authorization (EUA). This EUA will remain  in effect (meaning this test can be used) for the duration of the COVID-19 declaration under Section 564(b)(1) of the Act, 21 U.S.C.section 360bbb-3(b)(1), unless the authorization is terminated  or revoked  sooner.       Influenza A by PCR NEGATIVE NEGATIVE Final   Influenza B by PCR NEGATIVE NEGATIVE Final    Comment: (NOTE) The Xpert Xpress SARS-CoV-2/FLU/RSV plus assay is intended as an aid in the diagnosis of influenza from Nasopharyngeal swab specimens and should not be used as a sole basis for treatment. Nasal washings and aspirates are unacceptable for Xpert Xpress SARS-CoV-2/FLU/RSV testing.  Fact Sheet for Patients: EntrepreneurPulse.com.au  Fact Sheet for Healthcare Providers: IncredibleEmployment.be  This test is not yet approved or cleared by the [place] FDA and has been authorized for detection and/or diagnosis of SARS-CoV-2 by FDA under an Emergency Use Authorization (EUA). This EUA will remain in effect (meaning this test can be used) for the duration of the COVID-19 declaration under Section 564(b)(1) of the Act, 21 U.S.C. section 360bbb-3(b)(1), unless the authorization is terminated or revoked.  Performed at [facility], [address], [place], [place] [address]          Radiology Studies: No results found.      Scheduled Meds:  amLODipine  10 mg Oral Daily   atenolol  25 mg Oral Daily   atorvastatin  10 mg Oral Daily   cholecalciferol  1,000 Units Oral Daily   enoxaparin (LOVENOX) injection  30 mg Subcutaneous Q24H   feeding supplement  237 mL Oral BID BM   ferrous sulfate  325 mg Oral Q breakfast   hydrALAZINE  25 mg Oral Q8H   lisinopril  40 mg Oral Daily   meloxicam  3.75 mg Oral Daily   mirtazapine  7.5 mg Oral QHS   multivitamin with minerals  1 tablet Oral Daily   pantoprazole  40 mg Oral Daily   senna-docusate  2 tablet Oral BID   sertraline  25 mg Oral Daily   sucralfate  1 g Oral TID WC & HS   Continuous Infusions:   LOS: 2 days    Time spent: 25 minutes    Sharen Hones, MD [facility]   To contact the attending provider between 7A-7P or the covering provider  during after hours 7P-7A, please log into the web site [email or web address] and access using universal [place] password for that web site. If you do not have the password, please call the hospital operator.  05/22/2021, 12:44 PM

## 2021-05-23 DIAGNOSIS — D508 Other iron deficiency anemias: Secondary | ICD-10-CM

## 2021-05-23 DIAGNOSIS — N1831 Chronic kidney disease, stage 3a: Secondary | ICD-10-CM | POA: Diagnosis not present

## 2021-05-23 DIAGNOSIS — E7849 Other hyperlipidemia: Secondary | ICD-10-CM

## 2021-05-23 DIAGNOSIS — F0283 Dementia in other diseases classified elsewhere, unspecified severity, with mood disturbance: Secondary | ICD-10-CM | POA: Diagnosis not present

## 2021-05-23 DIAGNOSIS — I160 Hypertensive urgency: Secondary | ICD-10-CM | POA: Diagnosis not present

## 2021-05-23 DIAGNOSIS — I674 Hypertensive encephalopathy: Secondary | ICD-10-CM

## 2021-05-23 DIAGNOSIS — R4182 Altered mental status, unspecified: Secondary | ICD-10-CM

## 2021-05-23 LAB — BASIC METABOLIC PANEL
Anion gap: 4 — ABNORMAL LOW (ref 5–15)
BUN: 41 mg/dL — ABNORMAL HIGH (ref 8–23)
CO2: 26 mmol/L (ref 22–32)
Calcium: 8.8 mg/dL — ABNORMAL LOW (ref 8.9–10.3)
Chloride: 106 mmol/L (ref 98–111)
Creatinine, Ser: 1.66 mg/dL — ABNORMAL HIGH (ref 0.44–1.00)
GFR, Estimated: 28 mL/min — ABNORMAL LOW (ref >60)
Glucose, Bld: 101 mg/dL — ABNORMAL HIGH (ref 70–99)
Potassium: 4.8 mmol/L (ref 3.5–5.1)
Sodium: 136 mmol/L (ref 135–145)

## 2021-05-23 LAB — MAGNESIUM: Magnesium: 2.1 mg/dL (ref 1.7–2.4)

## 2021-05-23 MED ORDER — HYDRALAZINE HCL 10 MG PO TABS
10.0000 mg | ORAL_TABLET | Freq: Three times a day (TID) | ORAL | Status: DC
  Administered 2021-05-23 – 2021-05-24 (×4): 10 mg via ORAL
  Filled 2021-05-23 (×5): qty 1

## 2021-05-23 NOTE — TOC Progression Note (Addendum)
Transition of Care [facility]) - Progression Note    Patient Details  Name: SAMANDA BUSKE MRN: [id number] Date of Birth: [birth year]  Transition [facility]) CM/SW Contact  Maree Krabbe, LCSW Phone Number: 05/23/2021, 3:02 PM  Clinical Narrative:   Clinicals were sent to [facility] yesterday. They are considering taking the pt. Danielle with [facility] to speak with them today. CSW awaiting response from [place].  3:31- Danielle with [facility] has made a bed offer for pt. Pt family and facility are working together to get the ppwk completed. Duwayne Heck is waiting on a follow up call on projected date of when the facility is taking pt in.   Expected Discharge Plan: Memory Care Barriers to Discharge: Continued Medical Work up, Unsafe home situation  Expected Discharge Plan and Services Expected Discharge Plan: Memory Care       Living arrangements for the past 2 months: Single Family Home                                       Social Determinants of Health (SDOH) Interventions    Readmission Risk Interventions No flowsheet data found.

## 2021-05-23 NOTE — Progress Notes (Signed)
Nutrition Follow-up  DOCUMENTATION CODES:   Not applicable  INTERVENTION:   -Continue Ensure Enlive po BID, each supplement provides 350 kcal and 20 grams of protein.  -Continue MVI with minerals daily -Continue liberalize diet to regular  NUTRITION DIAGNOSIS:   Predicted suboptimal nutrient intake related to chronic illness (dementia) as evidenced by estimated needs.  Ongoing  GOAL:   Patient will meet greater than or equal to 90% of their needs  Progressing   MONITOR:   PO intake, Supplement acceptance, Diet advancement, Labs, Weight trends, Skin, I & O's  REASON FOR ASSESSMENT:   Malnutrition Screening Tool    ASSESSMENT:   Suzanne Beltran is a 88 y.o. female with medical history significant of hypertension, hyperlipidemia, iron deficiency anemia, GERD, depression, CKD stage IIIa, dementia, who presents with confusion.  Reviewed I/O's: +480 ml x 24 hours and +1.6 L since admission  Intake has improved since last visit. Noted meal completions 50-100%. Pt is drinking Ensure supplements.   Per MD notes, pt is medically stable for discharge once bed is available.    Medications reviewed and include vitamin D3 and ferrous sulfate.   Labs reviewed.   Diet Order:   Diet Order             Diet regular Room service appropriate? Yes; Fluid consistency: Thin  Diet effective now                   EDUCATION NEEDS:   Education needs have been addressed  Skin:  Skin Assessment: Reviewed RN Assessment  Last BM:  05/22/21  Height:   Ht Readings from Last 1 Encounters:  05/19/21 4\' 11"  (1.499 m)    Weight:   Wt Readings from Last 1 Encounters:  05/19/21 41.7 kg    Ideal Body Weight:  44.7 kg  BMI:  Body mass index is 18.57 kg/m.  Estimated Nutritional Needs:   Kcal:  1250-1450  Protein:  55-70 grams  Fluid:  > 1.2 L    Loistine Chance, RD, LDN, [place] Registered Dietitian II Certified Diabetes Care and Education Specialist Please refer to  [facility] for RD and/or RD on-call/weekend/after hours pager

## 2021-05-23 NOTE — Progress Notes (Signed)
°  Progress Note   Patient: Suzanne Beltran [id number] DOB: 01-27-1925 DOA: 05/19/2021     3 DOS: the patient was seen and examined on 05/23/2021   Brief hospital course: Suzanne Beltran is a 88 y.o. female with medical history significant of hypertension, hyperlipidemia, iron deficiency anemia, GERD, depression, CKD stage IIIa, dementia, who presents with confusion.  2/15- medically stable for discharge to memory unit when bed available.   Assessment and Plan: Hypertension urgency- resolved. Bps well controlled.  - continue home atenolol, amlodipine, lisinopril - hydralazine was scheduled this admission  Acute metabolic cephalopathy- per daughter on admission documentation, patient had returned to her mental baseline after admission. She was alert but not conversant with me this morning.   Dementia with behavioral disturbance. - continuous safety sitter - delirium precautions   AKI on CKD IIIa- chronic. Cr worsened today from admission. 1.12>1.66. differential includes the rapid decrease in her BP causing hypoperfusion.  - monitor with BMP am and consider decreasing BP medications  Subjective: patient is alert to voice but not following commands or conversant this am.   Physical Exam: Vitals:   05/22/21 2145 05/23/21 0051 05/23/21 0532 05/23/21 0722  BP: (!) 110/50 138/78 (!) 129/55 (!) 118/59  Pulse: 62 60 (!) 58 (!) 58  Resp:  18 18 18   Temp:  98.5 F (36.9 C) 98.5 F (36.9 C) 98.6 F (37 C)  TempSrc:  Oral  Oral  SpO2: 94% 94% 94% 94%  Weight:      Height:       General: NAD, resting Cardiac: RRR, 2+ radial and PT pulses bilaterally Respiratory: CTAB, normal effort Extremities: no edema. WWP. Skin: warm and dry, no rashes noted on exposed skin Neuro: alert but not conversant or following commands  Data Reviewed: BMP- positive for worsening kidney function, otherwise unremarkable  Family Communication: none  Disposition: Status is: Inpatient Remains inpatient  appropriate because: awaiting safe dispo plan and AKI   Planned Discharge Destination: Skilled nursing facility  enoxaparin (LOVENOX) injection 30 mg Start: 05/19/21 2200  Time spent: >60 minutes  Author: Richarda Osmond, MD 05/23/2021 7:34 AM  For on call review [email or web address].

## 2021-05-24 ENCOUNTER — Inpatient Hospital Stay: Payer: Medicare Other

## 2021-05-24 DIAGNOSIS — Z111 Encounter for screening for respiratory tuberculosis: Secondary | ICD-10-CM

## 2021-05-24 DIAGNOSIS — I160 Hypertensive urgency: Secondary | ICD-10-CM | POA: Diagnosis not present

## 2021-05-24 DIAGNOSIS — G9341 Metabolic encephalopathy: Secondary | ICD-10-CM | POA: Diagnosis not present

## 2021-05-24 DIAGNOSIS — R4182 Altered mental status, unspecified: Secondary | ICD-10-CM | POA: Diagnosis not present

## 2021-05-24 DIAGNOSIS — N1831 Chronic kidney disease, stage 3a: Secondary | ICD-10-CM | POA: Diagnosis not present

## 2021-05-24 LAB — BASIC METABOLIC PANEL
Anion gap: 4 — ABNORMAL LOW (ref 5–15)
BUN: 60 mg/dL — ABNORMAL HIGH (ref 8–23)
CO2: 26 mmol/L (ref 22–32)
Calcium: 8.7 mg/dL — ABNORMAL LOW (ref 8.9–10.3)
Chloride: 105 mmol/L (ref 98–111)
Creatinine, Ser: 1.65 mg/dL — ABNORMAL HIGH (ref 0.44–1.00)
GFR, Estimated: 29 mL/min — ABNORMAL LOW (ref >60)
Glucose, Bld: 93 mg/dL (ref 70–99)
Potassium: 4.4 mmol/L (ref 3.5–5.1)
Sodium: 135 mmol/L (ref 135–145)

## 2021-05-24 NOTE — Discharge Summary (Addendum)
Physician Discharge Summary  [name] IWO:[id number] DOB: 1924/04/27 DOA: 05/19/2021  PCP: Kandyce Rud, MD  Admit date: 05/19/2021 Discharge date: 05/25/2021  Admitted From: Home Disposition: Skilled nursing facility  Recommendations for Outpatient Follow-up:  Follow up with PCP within 1-2 weeks Recommend check metabolic panel. Cr 1.42 on day of discharge which is improving with holding of lisinopril and avoiding tight BP control Monitor BP Screening for depression severity and consider adjusting depression medications for improved control. Patient endorses anhedonia symptoms.   Discharge Condition:stable CODE STATUS:  Code Status: Full Code  Regular healthy diet  Brief/Interim Summary: Suzanne Beltran is a 88 y.o. female with medical history significant of hypertension, hyperlipidemia, iron deficiency anemia, GERD, depression, CKD stage IIIa, dementia. She presented to the ED on 2/11 from home with acute onset confusion as she was found by her neighbors for an unknown amount of time.  On presentation to the ED, she was alert and oriented to herself and place but not time.   Additionally, she was found to have elevated BP to 238/92.  In the ED, she was treated with BP management which was thought to be contributing to her acute metabolic encephalopathy.  Her workup was otherwise unremarkable including a head CT negative for acute intracranial abnormalities.  Her blood pressure was rapidly titrated to normal with restarting her home medications and addition of hydralazine but she then developed an AKI likely from hypoperfusion.  Once lisinopril and hydralazine were held, her blood pressures did somewhat elevate but her kidney function improved. (Systolics 120s-150s) Would recommend against tight BP control due to her age and AKI during admission.  Patient's mental status remained at baseline throughout her stay. She was recommended to be discharged to a memory care facility due  to dementia advancing to a point that would be unsafe for her to go home alone at this time.   Patient received a required chest xray TB screening for admission to SNF which did not show any signs of active TB infection. She was not exhibiting any symptoms of TB, it was just an admission screening.  Discharge Diagnoses:  Principal Problem:   Hypertensive urgency Active Problems:   Acute metabolic encephalopathy   Depression   Iron deficiency anemia   Dementia (HCC)   Chronic kidney disease, stage 3a (HCC)   HTN (hypertension)   HLD (hyperlipidemia)   Altered mental status   Hypertensive encephalopathy   Screening examination for pulmonary tuberculosis    Allergies as of 05/25/2021   No Known Allergies      Medication List     STOP taking these medications    lisinopril 40 MG tablet Commonly known as: ZESTRIL   meloxicam 7.5 MG tablet Commonly known as: MOBIC       TAKE these medications    acetaminophen 500 MG tablet Commonly known as: TYLENOL Take 500 mg by mouth as needed for mild pain or moderate pain.   amLODipine 10 MG tablet Commonly known as: NORVASC Take 10 mg by mouth daily.   atenolol 25 MG tablet Commonly known as: TENORMIN Take 25 mg by mouth daily.   cetirizine 10 MG tablet Commonly known as: ZYRTEC Take 10 mg by mouth daily as needed for allergies.   ferrous sulfate 325 (65 FE) MG tablet Take 325 mg by mouth daily with breakfast.   ICAPS PO Take 1 capsule by mouth daily.   mirtazapine 15 MG tablet Commonly known as: REMERON Take 7.5 mg by mouth at bedtime.  omeprazole 20 MG capsule Commonly known as: PRILOSEC Take 20 mg by mouth daily as needed (heartburn).   sertraline 50 MG tablet Commonly known as: ZOLOFT Take 25 mg by mouth daily.   VITAMIN D3 GUMMIES PO Take 50 mcg by mouth.        No Known Allergies  Consultations: None   Procedures/Studies: CT Head Wo Contrast  Result Date: 05/19/2021 CLINICAL DATA:   Altered mid pole status. Patient found wandering in her yard this morning. EXAM: CT HEAD WITHOUT CONTRAST TECHNIQUE: Contiguous axial images were obtained from the base of the skull through the vertex without intravenous contrast. RADIATION DOSE REDUCTION: This exam was performed according to the departmental dose-optimization program which includes automated exposure control, adjustment of the mA and/or kV according to patient size and/or use of iterative reconstruction technique. COMPARISON:  None. FINDINGS: Brain: No evidence of acute infarction, hemorrhage, hydrocephalus, extra-axial collection or mass lesion/mass effect. There is ventricular and sulcal enlargement consistent with age appropriate volume loss. Patchy areas of white matter hypoattenuation are noted bilaterally consistent with moderate chronic microvascular ischemic change. Vascular: No hyperdense vessel or unexpected calcification. Skull: Normal. Negative for fracture or focal lesion. Sinuses/Orbits: Globes and orbits are unremarkable. Visualized sinuses are clear. Other: None. IMPRESSION: 1. No acute intracranial abnormalities. Electronically Signed   By: Lajean Manes M.D.   On: 05/19/2021 15:39   DG Chest Port 1 View  Addendum Date: 05/25/2021   ADDENDUM REPORT: 05/25/2021 09:43 ADDENDUM: Impression: No focal airspace disease.  No evidence of active TB. Electronically Signed   By: Maurine Simmering M.D.   On: 05/25/2021 09:43   Result Date: 05/25/2021 CLINICAL DATA:  Screening exam for pulmonary tuberculosis EXAM: PORTABLE CHEST 1 VIEW COMPARISON:  Chest radiograph 05/19/2021 FINDINGS: The cardiomediastinal silhouette is within normal limits. Aortic arch calcifications. Large hiatal hernia. There is no focal airspace consolidation. There is no pleural effusion. No visible pneumothorax. There is no acute osseous abnormality. IMPRESSION: No focal airspace disease. Electronically Signed: By: Maurine Simmering M.D. On: 05/24/2021 15:22   DG Chest Port  1 View  Result Date: 05/19/2021 CLINICAL DATA:  Altered mental status. Found in the yard this morning wandering. EXAM: PORTABLE CHEST 1 VIEW COMPARISON:  None. FINDINGS: Cardiac silhouette is normal in size. Moderate size hiatal hernia. No mediastinal or hilar masses. Clear lungs.  No convincing pleural effusion or pneumothorax. Skeletal structures are demineralized, but grossly intact. IMPRESSION: No acute cardiopulmonary disease. Electronically Signed   By: Lajean Manes M.D.   On: 05/19/2021 15:37    Subjective: Patient feels overall well today but is tired of being in bed. She is unsure where she is being discharged to although we have talked about this daily.  She denies any self harm or SI thoughts. She endorses an overall positive outlook on living. Does express some statements that are consistent with anhedonia.   Discharge Exam: Vitals:   05/25/21 0519 05/25/21 0832  BP: (!) 150/55 (!) 156/65  Pulse: (!) 57 62  Resp: 17 18  Temp: 97.6 F (36.4 C) 98.6 F (37 C)  SpO2: 97% 94%    General: Pt is alert, awake, not in acute distress Cardiovascular: RRR, S1/S2 +, no rubs, no gallops Respiratory: CTA bilaterally, no wheezing, no rhonchi Abdominal: Soft, NT, ND Extremities: no edema, no cyanosis  Labs: Basic Metabolic Panel: Recent Labs  Lab 05/19/21 1444 05/23/21 0414 05/24/21 0430 05/25/21 0409  NA 137 136 135 136  K 4.0 4.8 4.4 4.4  CL 106 106  105 106  CO2 26 26 26 27   GLUCOSE 123* 101* 93 95  BUN 21 41* 60* 56*  CREATININE 1.12* 1.66* 1.65* 1.42*  CALCIUM 8.7* 8.8* 8.7* 8.7*  MG  --  2.1  --   --    CBC: Recent Labs  Lab 05/19/21 1354 05/19/21 1444 05/20/21 0623  WBC 7.1 6.9  --   NEUTROABS 4.9  --   --   HGB 12.0 12.0  --   HCT 38.2 38.4 34.5  MCV 96.2 96.0  --   PLT 336 322  --     Microbiology Recent Results (from the past 240 hour(s))  Resp Panel by RT-PCR (Flu A&B, Covid) Nasopharyngeal Swab     Status: None   Collection Time: 05/19/21  5:27 PM    Specimen: Nasopharyngeal Swab; Nasopharyngeal(NP) swabs in vial transport medium  Result Value Ref Range Status   SARS Coronavirus 2 by RT PCR NEGATIVE NEGATIVE Final    Comment: (NOTE) SARS-CoV-2 target nucleic acids are NOT DETECTED.  The SARS-CoV-2 RNA is generally detectable in upper respiratory specimens during the acute phase of infection. The lowest concentration of SARS-CoV-2 viral copies this assay can detect is 138 copies/mL. A negative result does not preclude SARS-Cov-2 infection and should not be used as the sole basis for treatment or other patient management decisions. A negative result may occur with  improper specimen collection/handling, submission of specimen other than nasopharyngeal swab, presence of viral mutation(s) within the areas targeted by this assay, and inadequate number of viral copies(<138 copies/mL). A negative result must be combined with clinical observations, patient history, and epidemiological information. The expected result is Negative.  Fact Sheet for Patients:  EntrepreneurPulse.com.au  Fact Sheet for Healthcare Providers:  IncredibleEmployment.be  This test is no t yet approved or cleared by the [place] FDA and  has been authorized for detection and/or diagnosis of SARS-CoV-2 by FDA under an Emergency Use Authorization (EUA). This EUA will remain  in effect (meaning this test can be used) for the duration of the COVID-19 declaration under Section 564(b)(1) of the Act, 21 U.S.C.section 360bbb-3(b)(1), unless the authorization is terminated  or revoked sooner.       Influenza A by PCR NEGATIVE NEGATIVE Final   Influenza B by PCR NEGATIVE NEGATIVE Final    Comment: (NOTE) The Xpert Xpress SARS-CoV-2/FLU/RSV plus assay is intended as an aid in the diagnosis of influenza from Nasopharyngeal swab specimens and should not be used as a sole basis for treatment. Nasal washings and aspirates are  unacceptable for Xpert Xpress SARS-CoV-2/FLU/RSV testing.  Fact Sheet for Patients: EntrepreneurPulse.com.au  Fact Sheet for Healthcare Providers: IncredibleEmployment.be  This test is not yet approved or cleared by the [place] FDA and has been authorized for detection and/or diagnosis of SARS-CoV-2 by FDA under an Emergency Use Authorization (EUA). This EUA will remain in effect (meaning this test can be used) for the duration of the COVID-19 declaration under Section 564(b)(1) of the Act, 21 U.S.C. section 360bbb-3(b)(1), unless the authorization is terminated or revoked.  Performed at [facility], [address] [place], [place] [address]     Time coordinating discharge: Over 30 minutes  Richarda Osmond, MD  [facility] 05/25/2021, 9:52 AM

## 2021-05-24 NOTE — TOC Progression Note (Signed)
Transition of Care [facility]) - Progression Note    Patient Details  Name: Suzanne Beltran MRN: [id number] Date of Birth: [birth year]  Transition of Care [facility]) CM/SW Contact  Maree Krabbe, LCSW Phone Number: 05/24/2021, 12:57 PM  Clinical Narrative:   Clincials faxed to [facility].    Expected Discharge Plan: Memory Care Barriers to Discharge: Continued Medical Work up, Unsafe home situation  Expected Discharge Plan and Services Expected Discharge Plan: Memory Care       Living arrangements for the past 2 months: Single Family Home                                       Social Determinants of Health (SDOH) Interventions    Readmission Risk Interventions No flowsheet data found.

## 2021-05-24 NOTE — Progress Notes (Addendum)
°  Progress Note   Patient: Suzanne Beltran [id number] DOB: 12-27-1924 DOA: 05/19/2021     4 DOS: the patient was seen and examined on 05/24/2021   Brief hospital course: Suzanne Beltran is a 88 y.o. female with medical history significant of hypertension, hyperlipidemia, iron deficiency anemia, GERD, depression, CKD stage IIIa, dementia, who presents with confusion.  2/16- medically stable for discharge to memory unit when bed available.   Assessment and Plan: Hypertension urgency- resolved. Bps well controlled.  - continue home atenolol, amlodipine - hold lisinopril for AKI - discontinue hydralazine. Avoid tight BP control to avoid hypoperfusion of kidneys.  Acute metabolic cephalopathy- per daughter on admission documentation, patient had returned to her mental baseline after admission. She was alert but not conversant with me this morning.   Dementia with behavioral disturbance. - continuous safety sitter - delirium precautions   AKI on CKD IIIa- chronic. Cr stable. 1.12>1.66>1.65. differential includes the rapid decrease in her BP causing hypoperfusion.  - monitor with BMP am and consider decreasing BP medications  Subjective: patient reports no complaints. She would like to go home. She is overall doing well.  Physical Exam: Vitals:   05/23/21 1133 05/23/21 1543 05/23/21 1930 05/24/21 0433  BP: (!) 127/53 (!) 127/55 137/65 (!) 136/56  Pulse: 62 67 64 (!) 55  Resp: 17 17 18 20   Temp:  98.2 F (36.8 C) 98.2 F (36.8 C) 97.9 F (36.6 C)  TempSrc: Oral Oral Oral Oral  SpO2: 98% 97% 97% 97%  Weight:      Height:       General: NAD, alert Cardiac: RRR, 2+ radial and PT pulses bilaterally Respiratory: CTAB, normal effort Extremities: no edema. WWP. Skin: warm and dry, no rashes noted on exposed skin Neuro: alert and oriented to self and place  Data Reviewed: BMP- showing stable kidney function, otherwise unremarkable  Family Communication:  none  Disposition: Status is: Inpatient Remains inpatient appropriate because: awaiting safe dispo plan and AKI   Planned Discharge Destination: Skilled nursing facility  enoxaparin (LOVENOX) injection 30 mg Start: 05/19/21 2200  Time spent: >60 minutes  Author: Richarda Osmond, MD 05/24/2021 7:32 AM  For on call review [email or web address].

## 2021-05-24 NOTE — Care Management Important Message (Signed)
Important Message  Patient Details  Name: Suzanne Beltran MRN: [id number] Date of Birth: 1924-04-13   Medicare Important Message Given:  Yes     [name] 05/24/2021, 3:19 PM

## 2021-05-24 NOTE — Plan of Care (Signed)
  Problem: Clinical Measurements: Goal: Ability to maintain clinical measurements within normal limits will improve Outcome: Progressing   Problem: Clinical Measurements: Goal: Respiratory complications will improve Outcome: Progressing   Problem: Clinical Measurements: Goal: Cardiovascular complication will be avoided Outcome: Progressing   Problem: Activity: Goal: Risk for activity intolerance will decrease Outcome: Progressing   Problem: Pain Managment: Goal: General experience of comfort will improve Outcome: Progressing   Problem: Safety: Goal: Ability to remain free from injury will improve Outcome: Progressing   

## 2021-05-24 NOTE — TOC Progression Note (Signed)
Transition of Care [facility]) - Progression Note    Patient Details  Name: Suzanne Beltran MRN: [id number] Date of Birth: 1924-06-11  Transition of Care [facility]) CM/SW Contact  Eileen Stanford, LCSW Phone Number: 05/24/2021, 3:24 PM  Clinical Narrative:  [name] is requesting a chest xray ruling out TB. If this is completed [name] can take tomorrow. MD notified.     Expected Discharge Plan: Memory Care Barriers to Discharge: Continued Medical Work up, Unsafe home situation  Expected Discharge Plan and Services Expected Discharge Plan: Memory Care       Living arrangements for the past 2 months: Single Family Home                                       Social Determinants of Health (SDOH) Interventions    Readmission Risk Interventions No flowsheet data found.

## 2021-05-25 DIAGNOSIS — I160 Hypertensive urgency: Secondary | ICD-10-CM | POA: Diagnosis not present

## 2021-05-25 DIAGNOSIS — N1831 Chronic kidney disease, stage 3a: Secondary | ICD-10-CM | POA: Diagnosis not present

## 2021-05-25 DIAGNOSIS — G9341 Metabolic encephalopathy: Secondary | ICD-10-CM | POA: Diagnosis not present

## 2021-05-25 DIAGNOSIS — R4182 Altered mental status, unspecified: Secondary | ICD-10-CM | POA: Diagnosis not present

## 2021-05-25 LAB — BASIC METABOLIC PANEL
Anion gap: 3 — ABNORMAL LOW (ref 5–15)
BUN: 56 mg/dL — ABNORMAL HIGH (ref 8–23)
CO2: 27 mmol/L (ref 22–32)
Calcium: 8.7 mg/dL — ABNORMAL LOW (ref 8.9–10.3)
Chloride: 106 mmol/L (ref 98–111)
Creatinine, Ser: 1.42 mg/dL — ABNORMAL HIGH (ref 0.44–1.00)
GFR, Estimated: 34 mL/min — ABNORMAL LOW (ref >60)
Glucose, Bld: 95 mg/dL (ref 70–99)
Potassium: 4.4 mmol/L (ref 3.5–5.1)
Sodium: 136 mmol/L (ref 135–145)

## 2021-05-25 NOTE — TOC Transition Note (Signed)
Transition of Care [facility]) - CM/SW Discharge Note   Patient Details  Name: Suzanne Beltran MRN: [id number] Date of Birth: 10/16/1924  Transition of Care [facility]) CM/SW Contact:  Eileen Stanford, LCSW Phone Number: 05/25/2021, 11:04 AM   Clinical Narrative:   Clinical Social Worker facilitated patient discharge including contacting patient family and facility to confirm patient discharge plans.  Clinical information faxed to facility and family agreeable with plan.  CSW arranged ambulance transport via [place] to [facility] (2:00) .  RN to call for report prior to discharge.     Final next level of care: Memory Care Barriers to Discharge: No Barriers Identified   Patient Goals and CMS Choice Patient states their goals for this hospitalization and ongoing recovery are:: ALF memory care placement CMS Medicare.gov Compare Post Acute Care list provided to:: Patient Represenative (must comment) Choice offered to / list presented to : Adult Children  Discharge Placement              Patient chooses bed at:  [facility]) Patient to be transferred to facility by: [facility] Name of family member notified: Sam Patient and family notified of of transfer: 05/25/21  Discharge Plan and Services                                     Social Determinants of Health (SDOH) Interventions     Readmission Risk Interventions No flowsheet data found.

## 2021-05-25 NOTE — Progress Notes (Addendum)
Three attempts made to call report to [facility] in [place], no staff available to take report at this time. I have requested for a nurse to call me back before two to give report.   1233 Report given to [facility] from [place].

## 2021-05-25 NOTE — NC FL2 (Signed)
°  [place] LEVEL OF CARE SCREENING TOOL     IDENTIFICATION  Patient Name: Suzanne Beltran Birthdate: 06-Jan-1925 Sex: female Admission Date (Current Location): 05/19/2021  [place] and [place] Number:  Engineering geologist and Address:  [facility], [address] [place], [place] [address]      Provider Number: [id number]  Attending Physician Name and Address:  Richarda Osmond, MD  Relative Name and Phone Number:  Alda Berthold [phone]    Current Level of Care: Hospital Recommended Level of Care: [place], Memory Care Prior Approval Number:    Date Approved/Denied:   PASRR Number:    Discharge Plan:      Current Diagnoses: Patient Active Problem List   Diagnosis Date Noted   Screening examination for pulmonary tuberculosis    Altered mental status    Hypertensive encephalopathy    Hypertensive urgency [id number]   Acute metabolic encephalopathy [id number]   GERD (gastroesophageal reflux disease) 05/19/2021   Depression 05/19/2021   Iron deficiency anemia 05/19/2021   Dementia ([place]) 05/19/2021   Chronic kidney disease, stage 3a ([place]) 05/19/2021   Memory deficit 05/19/2021   HLD (hyperlipidemia) 05/19/2021   HTN (hypertension)     Orientation RESPIRATION BLADDER Height & Weight     Self  Normal Continent Weight: 91 lb 14.9 oz (41.7 kg) Height:  4\' 11"  (149.9 cm)  BEHAVIORAL SYMPTOMS/MOOD NEUROLOGICAL BOWEL NUTRITION STATUS  Wanderer     Diet (no added salt, regular diet with thin liquids)  AMBULATORY STATUS COMMUNICATION OF NEEDS Skin   Supervision Verbally Normal                       Personal Care Assistance Level of Assistance  Bathing, Feeding, Dressing Bathing Assistance: Limited assistance Feeding assistance: Limited assistance Dressing Assistance: Limited assistance     Functional Limitations Info             SPECIAL CARE FACTORS FREQUENCY                        Contractures      Additional Factors Info  Code Status, Allergies Code Status Info: full Allergies Info: nka             Discharge Medications: acetaminophen 500 MG tablet Commonly known as: TYLENOL Take 500 mg by mouth as needed for mild pain or moderate pain.    amLODipine 10 MG tablet Commonly known as: NORVASC Take 10 mg by mouth daily.    atenolol 25 MG tablet Commonly known as: TENORMIN Take 25 mg by mouth daily.    cetirizine 10 MG tablet Commonly known as: ZYRTEC Take 10 mg by mouth daily as needed for allergies.    ferrous sulfate 325 (65 FE) MG tablet Take 325 mg by mouth daily with breakfast.    ICAPS PO Take 1 capsule by mouth daily.    mirtazapine 15 MG tablet Commonly known as: REMERON Take 7.5 mg by mouth at bedtime.    omeprazole 20 MG capsule Commonly known as: PRILOSEC Take 20 mg by mouth daily as needed (heartburn).    sertraline 50 MG tablet Commonly known as: ZOLOFT Take 25 mg by mouth daily.    VITAMIN D3 GUMMIES PO Take 50 mcg by mouth.      Relevant Imaging Results:  Relevant Lab Results:   Additional Information SS #: Creola Emaly Boschert, LCSW

## 2021-05-25 NOTE — Progress Notes (Signed)
Patient still awaiting EMS transport. Patient's son in law agreeable to transport patient (per his request) to [facility].

## 2021-05-25 NOTE — TOC Progression Note (Signed)
Transition of Care [facility]) - Progression Note    Patient Details  Name: Suzanne Beltran MRN: [id number] Date of Birth: Aug 15, 1924  Transition of Care [facility]) CM/SW Contact  Maree Krabbe, LCSW Phone Number: 05/25/2021, 10:13 AM  Clinical Narrative:   Pt can transfer to [facility] today (room 63).     Expected Discharge Plan: Memory Care Barriers to Discharge: Continued Medical Work up, Unsafe home situation  Expected Discharge Plan and Services Expected Discharge Plan: Memory Care       Living arrangements for the past 2 months: Single Family Home Expected Discharge Date: 05/25/21                                     Social Determinants of Health (SDOH) Interventions    Readmission Risk Interventions No flowsheet data found.

## 2022-01-24 ENCOUNTER — Other Ambulatory Visit: Payer: Self-pay

## 2022-01-24 ENCOUNTER — Emergency Department
Admission: EM | Admit: 2022-01-24 | Discharge: 2022-01-24 | Disposition: A | Discharge status: Home / Self Care | Payer: Medicare Other | Attending: Emergency Medicine | Admitting: Emergency Medicine

## 2022-01-24 DIAGNOSIS — F0390 Unspecified dementia without behavioral disturbance: Secondary | ICD-10-CM | POA: Insufficient documentation

## 2022-01-24 DIAGNOSIS — R300 Dysuria: Secondary | ICD-10-CM | POA: Insufficient documentation

## 2022-01-24 DIAGNOSIS — R350 Frequency of micturition: Secondary | ICD-10-CM | POA: Insufficient documentation

## 2022-01-24 DIAGNOSIS — R509 Fever, unspecified: Secondary | ICD-10-CM

## 2022-01-24 DIAGNOSIS — U071 COVID-19: Secondary | ICD-10-CM | POA: Diagnosis not present

## 2022-01-24 LAB — URINALYSIS, ROUTINE W REFLEX MICROSCOPIC
Bilirubin Urine: NEGATIVE
Glucose, UA: NEGATIVE mg/dL
Hgb urine dipstick: NEGATIVE
Ketones, ur: NEGATIVE mg/dL
Nitrite: NEGATIVE
Protein, ur: 100 mg/dL — AB
Specific Gravity, Urine: 1.013 (ref 1.005–1.030)
pH: 7 (ref 5.0–8.0)

## 2022-01-24 LAB — COMPREHENSIVE METABOLIC PANEL
ALT: 17 U/L (ref 0–44)
AST: 27 U/L (ref 15–41)
Albumin: 3.7 g/dL (ref 3.5–5.0)
Alkaline Phosphatase: 86 U/L (ref 38–126)
Anion gap: 8 (ref 5–15)
BUN: 25 mg/dL — ABNORMAL HIGH (ref 8–23)
CO2: 27 mmol/L (ref 22–32)
Calcium: 9.3 mg/dL (ref 8.9–10.3)
Chloride: 98 mmol/L (ref 98–111)
Creatinine, Ser: 1.1 mg/dL — ABNORMAL HIGH (ref 0.44–1.00)
GFR, Estimated: 46 mL/min — ABNORMAL LOW (ref >60)
Glucose, Bld: 116 mg/dL — ABNORMAL HIGH (ref 70–99)
Potassium: 3.9 mmol/L (ref 3.5–5.1)
Sodium: 133 mmol/L — ABNORMAL LOW (ref 135–145)
Total Bilirubin: 0.5 mg/dL (ref 0.3–1.2)
Total Protein: 7.6 g/dL (ref 6.5–8.1)

## 2022-01-24 LAB — CBC WITH DIFFERENTIAL/PLATELET
Abs Immature Granulocytes: 0.1 10*3/uL — ABNORMAL HIGH (ref 0.00–0.07)
Basophils Absolute: 0 10*3/uL (ref 0.0–0.1)
Basophils Relative: 0 %
Eosinophils Absolute: 0.1 10*3/uL (ref 0.0–0.5)
Eosinophils Relative: 1 %
HCT: 34.5 % — ABNORMAL LOW (ref 36.0–46.0)
Hemoglobin: 11.2 g/dL — ABNORMAL LOW (ref 12.0–15.0)
Immature Granulocytes: 1 %
Lymphocytes Relative: 8 %
Lymphs Abs: 0.7 10*3/uL (ref 0.7–4.0)
MCH: 30.1 pg (ref 26.0–34.0)
MCHC: 32.5 g/dL (ref 30.0–36.0)
MCV: 92.7 fL (ref 80.0–100.0)
Monocytes Absolute: 0.6 10*3/uL (ref 0.1–1.0)
Monocytes Relative: 6 %
Neutro Abs: 8.2 10*3/uL — ABNORMAL HIGH (ref 1.7–7.7)
Neutrophils Relative %: 84 %
Platelets: 338 10*3/uL (ref 150–400)
RBC: 3.72 MIL/uL — ABNORMAL LOW (ref 3.87–5.11)
RDW: 14.3 % (ref 11.5–15.5)
WBC: 9.8 10*3/uL (ref 4.0–10.5)
nRBC: 0 % (ref 0.0–0.2)

## 2022-01-24 LAB — RESP PANEL BY RT-PCR (FLU A&B, COVID) ARPGX2
Influenza A by PCR: NEGATIVE
Influenza B by PCR: NEGATIVE
SARS Coronavirus 2 by RT PCR: POSITIVE — AB

## 2022-01-24 MED ORDER — NIRMATRELVIR/RITONAVIR (PAXLOVID) TABLET (RENAL DOSING): 2.0000 | ORAL_TABLET | Freq: Two times a day (BID) | ORAL | 0 refills | Status: AC

## 2022-01-24 NOTE — Discharge Instructions (Signed)
Please seek medical attention for any persistent high fevers, chest pain, shortness of breath, change in behavior, persistent vomiting, bloody stool or any other new or concerning symptoms.

## 2022-01-24 NOTE — ED Notes (Signed)
Report given to Maybell at [facility] assisted [facility]. Beth made aware of new RX and this will also be discussed with family who will be transport pt back.   D/C and new RX and reasons to return discussed with family and pt as well as [place] saff, all verbalized understanding.

## 2022-01-24 NOTE — ED Triage Notes (Addendum)
Pt presents to ED via [facility] from [facility] with c/o fever. Per EMS, pts temperature at 0215 was 101.2 and then 100.2 at 0530. Pts temp 100.2 here. Pt has new cough. Pt not complaining of any pain, CP, SOB.   186/82 98% RA 83 HR

## 2022-01-24 NOTE — ED Notes (Signed)
Paper discharge signed by Daughter/DPOA.

## 2022-01-24 NOTE — ED Provider Notes (Signed)
[facility] Provider Note    Event Date/Time   First MD Initiated Contact with Patient 01/24/22 [phone]     (approximate)   History   Fever   HPI  Suzanne Beltran is a 88 y.o. female who comes from nursing home with a history of dementia.  Reportedly she had a fever at the nursing home and they did not have any Tylenol for her.  They sent her here for evaluation and to get some Tylenol.  Here patient says she is not sure why she is here.  She initially reported some urinary frequency urgency and dysuria and later denied that.  She had a temperature of 100.2.  She denies any chest pain or shortness of breath or abdominal pain or any other symptoms.  Family reports she has developed a slight dry cough in the last day or 2.  O2 sats here 97%.      Physical Exam   Triage Vital Signs: ED Triage Vitals  Enc Vitals Group     BP 01/24/22 0551 (!) 184/71     Pulse Rate 01/24/22 0551 80     Resp 01/24/22 0551 18     Temp 01/24/22 0551 100.2 F (37.9 C)     Temp Source 01/24/22 0551 Oral     SpO2 01/24/22 0551 97 %     Weight 01/24/22 0555 103 lb 2.8 oz (46.8 kg)     Height --      Head Circumference --      Peak Flow --      Pain Score 01/24/22 0555 0     Pain Loc --      Pain Edu? --      Excl. in GC? --     Most recent vital signs: Vitals:   01/24/22 0551  BP: (!) 184/71  Pulse: 80  Resp: 18  Temp: 100.2 F (37.9 C)  SpO2: 97%     General: Awake, no distress.  CV:  Good peripheral perfusion.  Heart regular rate and rhythm no audible murmurs Resp:  Normal effort.  Lungs are clear Abd:  No distention.  Soft and nontender Extremities: No edema   ED Results / Procedures / Treatments   Labs (all labs ordered are listed, but only abnormal results are displayed) Labs Reviewed  CBC WITH DIFFERENTIAL/PLATELET - Abnormal; Notable for the following components:      Result Value   RBC 3.72 (*)    Hemoglobin 11.2 (*)    HCT 34.5 (*)    Neutro  Abs 8.2 (*)    Abs Immature Granulocytes 0.10 (*)    All other components within normal limits  URINALYSIS, ROUTINE W REFLEX MICROSCOPIC - Abnormal; Notable for the following components:   Color, Urine YELLOW (*)    APPearance CLEAR (*)    Protein, ur 100 (*)    Leukocytes,Ua SMALL (*)    Bacteria, UA RARE (*)    All other components within normal limits  RESP PANEL BY RT-PCR (FLU A&B, COVID) [id number]  URINE CULTURE  COMPREHENSIVE METABOLIC PANEL     EKG  Pending   RADIOLOGY Pending  PROCEDURES:  Critical Care performed:   Procedures   MEDICATIONS ORDERED IN ED: Medications - No data to display   IMPRESSION / MDM / ASSESSMENT AND PLAN / ED COURSE  I reviewed the triage vital signs and the nursing notes. Patient does have a slight dry cough.  We will send a COVID and a flu swab.  Patient's urine has returned and is essentially negative.  There is small leukocyte esterase and rare bacteria but no appreciable number of white cells or red cells.  White count is normal with there is 84% polys.  Chest x-ray is pending.  Differential diagnosis includes, but is not limited to, fever and dry cough.  Possibly this is due to COVID or pneumonia or the flu.  We will continue to investigate her further.  EKG is also still pending.  Patient's presentation is most consistent with acute complicated illness / injury requiring diagnostic workup.  I will have to sign this patient out to the oncoming physician.      FINAL CLINICAL IMPRESSION(S) / ED DIAGNOSES   Final diagnoses:  Febrile illness     Rx / DC Orders   ED Discharge Orders     None        Note:  This document was prepared using Dragon voice recognition software and may include unintentional dictation errors.   Nena Polio, MD 01/24/22 [phone]

## 2022-01-24 NOTE — ED Provider Notes (Signed)
COVID positive. Patient without any supplemental oxygen requirement. Discussed finding with family at bedside. Will discharge with paxlovid.    Nance Pear, MD 01/24/22 [phone]

## 2022-01-25 ENCOUNTER — Ambulatory Visit: Payer: Medicare Other | Admitting: Podiatry

## 2022-01-25 LAB — URINE CULTURE

## 2022-02-08 ENCOUNTER — Encounter: Payer: Self-pay | Admitting: Podiatry

## 2022-02-08 ENCOUNTER — Ambulatory Visit (INDEPENDENT_AMBULATORY_CARE_PROVIDER_SITE_OTHER): Payer: Medicare Other | Admitting: Podiatry

## 2022-02-08 DIAGNOSIS — M79675 Pain in left toe(s): Secondary | ICD-10-CM

## 2022-02-08 DIAGNOSIS — M79674 Pain in right toe(s): Secondary | ICD-10-CM

## 2022-02-08 DIAGNOSIS — B351 Tinea unguium: Secondary | ICD-10-CM | POA: Diagnosis not present

## 2022-02-08 NOTE — Progress Notes (Signed)
   Chief Complaint  Patient presents with   Debridement    Patient states they sent her here for toenail trimming, "they don't bother me"   New Patient (Initial Visit)    SUBJECTIVE Patient presents to office today complaining of elongated, thickened nails that cause pain while ambulating in shoes.  Patient is unable to trim their own nails. Patient is here for further evaluation and treatment.  Past Medical History:  Diagnosis Date   Depression    GERD (gastroesophageal reflux disease)    Hypertension    Iron deficiency anemia     No Known Allergies   OBJECTIVE General Patient is awake, alert, and oriented x 3 and in no acute distress. Derm Skin is dry and supple bilateral. Negative open lesions or macerations. Remaining integument unremarkable. Nails are tender, long, thickened and dystrophic with subungual debris, consistent with onychomycosis, 1-5 bilateral. No signs of infection noted. Vasc  DP and PT pedal pulses palpable bilaterally. Temperature gradient within normal limits.  Neuro Epicritic and protective threshold sensation grossly intact bilaterally.  Musculoskeletal Exam No symptomatic pedal deformities noted bilateral. Muscular strength within normal limits.  ASSESSMENT 1.  Pain due to onychomycosis of toenails both  PLAN OF CARE 1. Patient evaluated today.  2. Instructed to maintain good pedal hygiene and foot care.  3. Mechanical debridement of nails 1-5 bilaterally performed using a nail nipper. Filed with dremel without incident.  4. Return to clinic in 3 mos.    Edrick Kins, DPM [facility]  Dr. Edrick Kins, DPM    [address], [place] [address]                Office [phone]  Fax [phone]

## 2022-02-14 ENCOUNTER — Telehealth: Payer: Self-pay | Admitting: *Deleted

## 2022-02-14 NOTE — Telephone Encounter (Signed)
Faxed office notes from 02/08/22 to [facility] Lanora Manis) per their request, confirmation received 02/14/22.

## 2022-04-13 IMAGING — CT CT HEAD W/O CM
4 series · 17 of 47 positions shown, 19 images · non-contrast
Comparison: None.

CLINICAL DATA: Altered mid pole status. Patient found wandering in
her yard this morning.



[Series 2: head wo · axial · 0.42mm/px · z∈[-103,+7]mm · 7 of 30 slices shown, 9 images]
[im 4/30  brain]
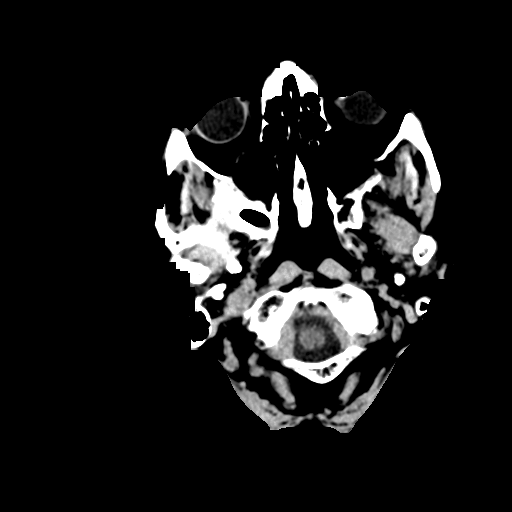
[im 4/30  bone]
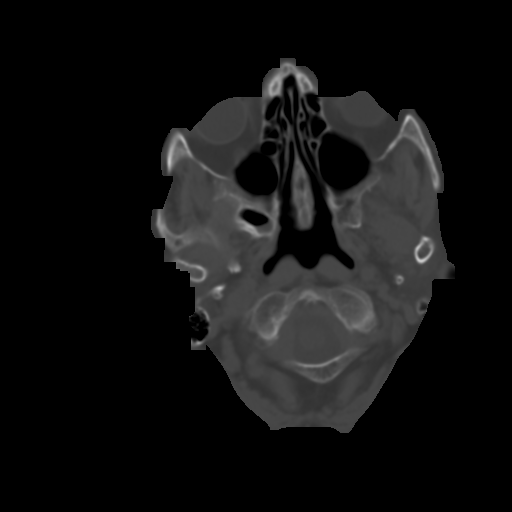
[im 8/30  brain]
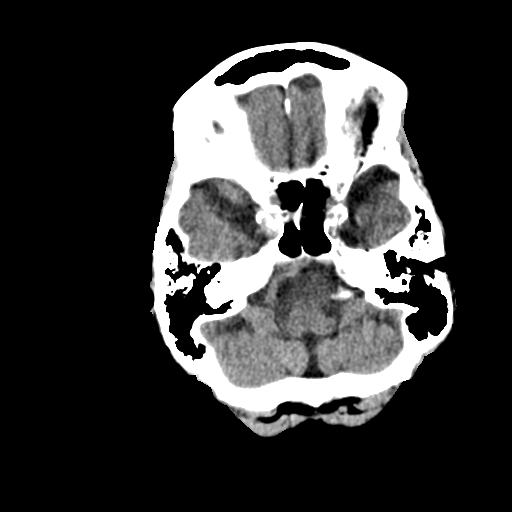
[im 11/30  brain]
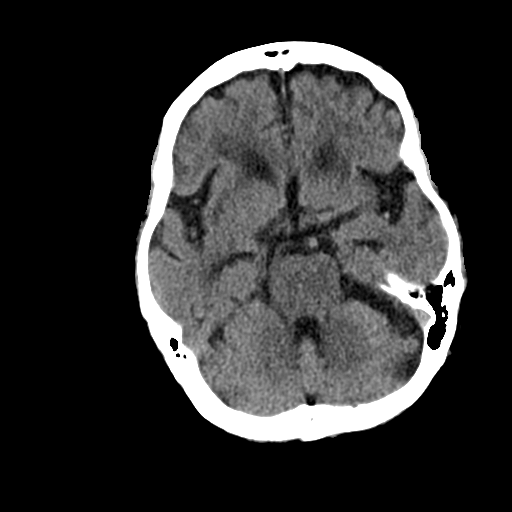
[im 15/30  brain]
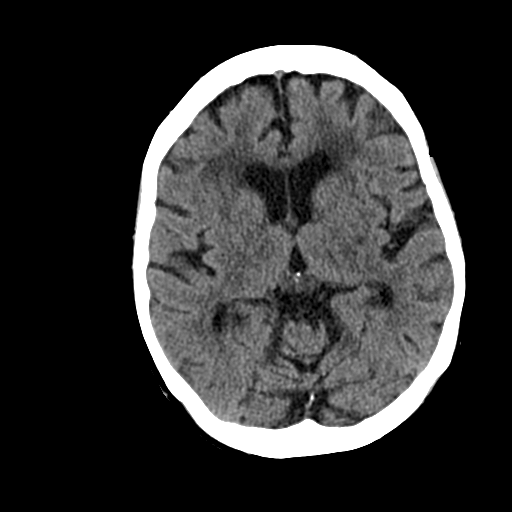
[im 19/30  brain]
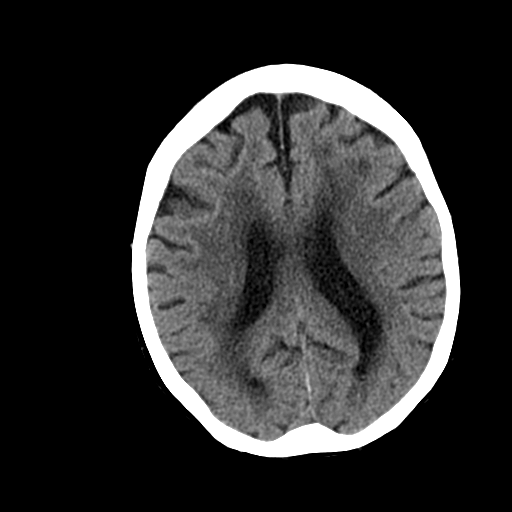
[im 19/30  bone]
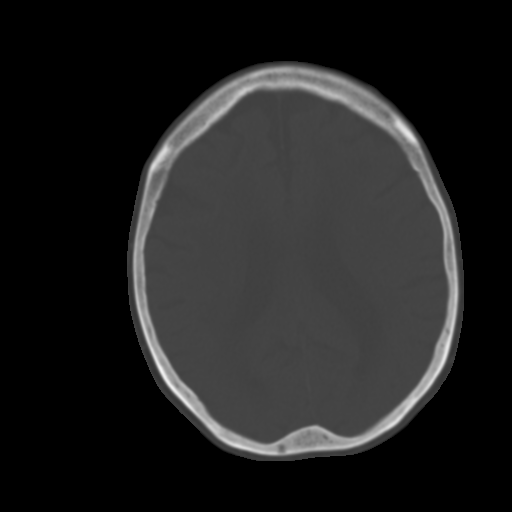
[im 22/30  brain]
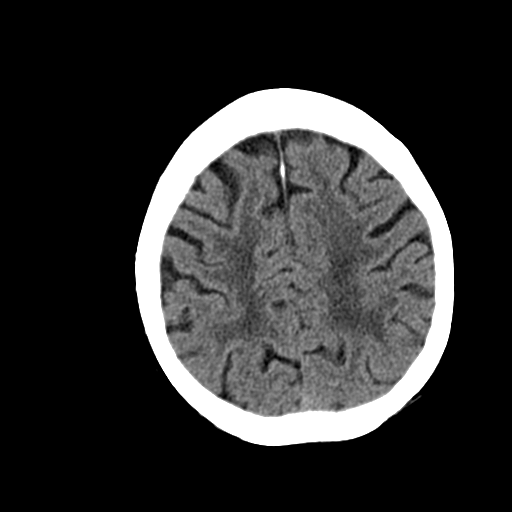
[im 26/30  brain]
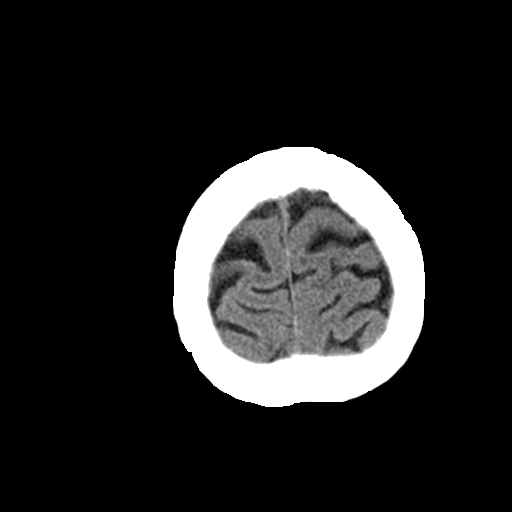

[Series 3: head bone · axial · 0.42mm/px · z∈[-104,-52]mm · 4 of 75 slices shown]
[im 8/75  bone]
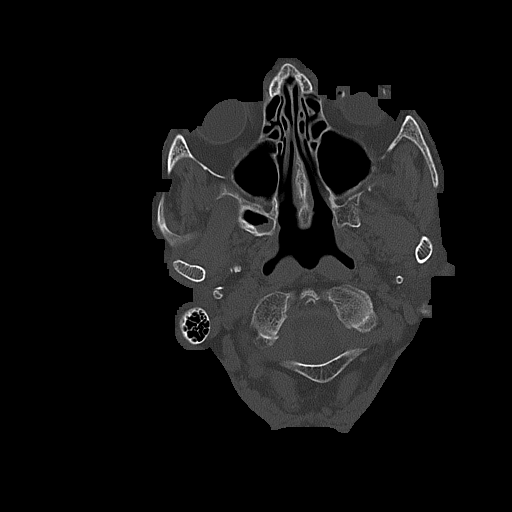
[im 15/75  bone]
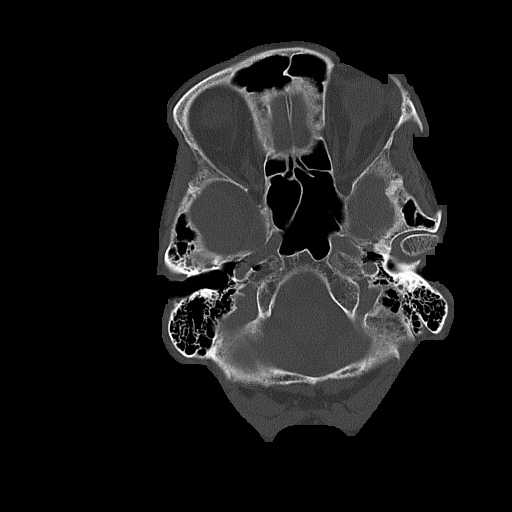
[im 23/75  bone]
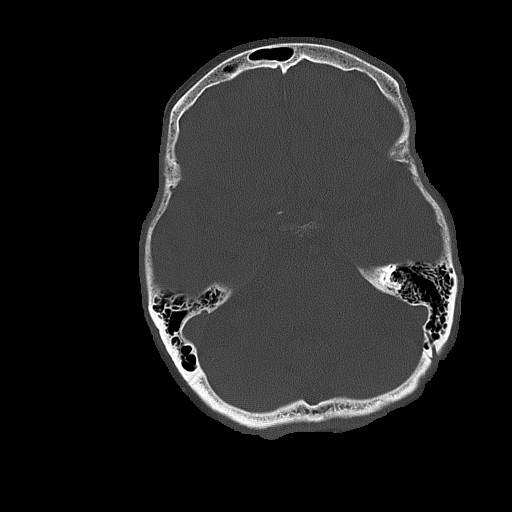
[im 34/75  bone]
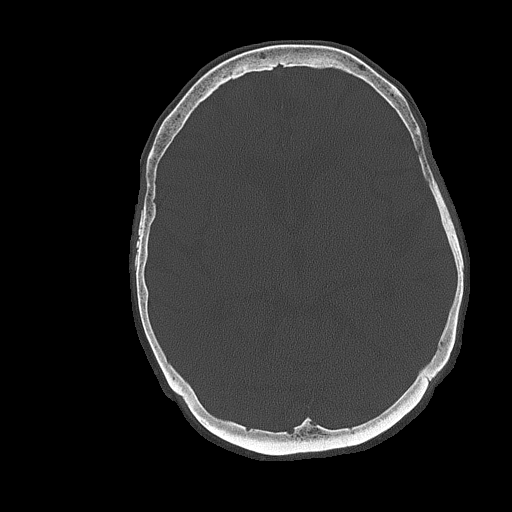

[Series 4: coronal soft tissue · coronal · 0.32mm/px · 3 of 62 slices shown]
[im 21/62  brain]
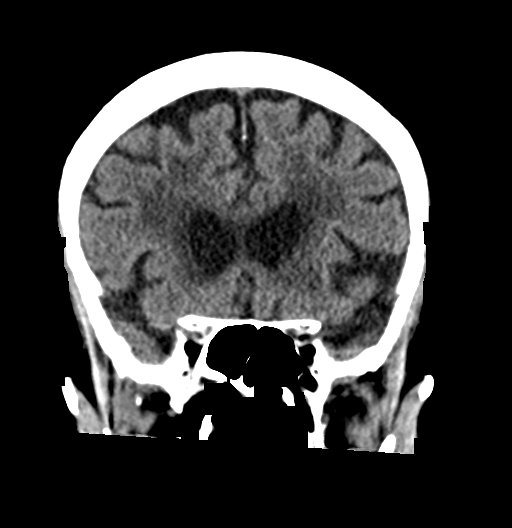
[im 28/62  brain]
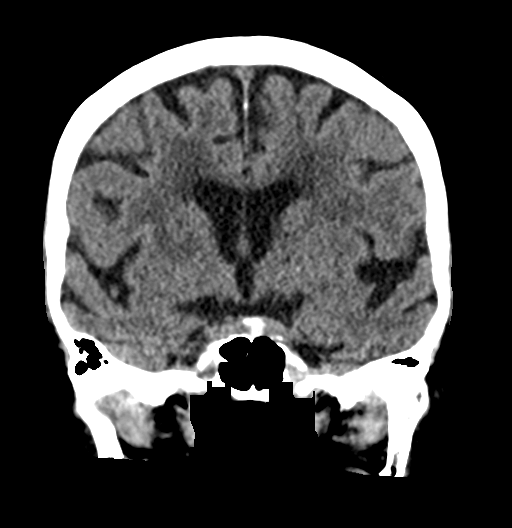
[im 34/62  brain]
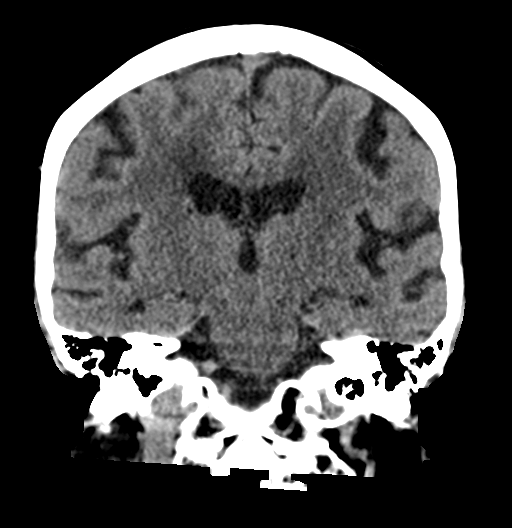

[Series 5: sagittal soft tissue · sagittal · 0.32mm/px · 3 of 56 slices shown]
[im 19/56  brain]
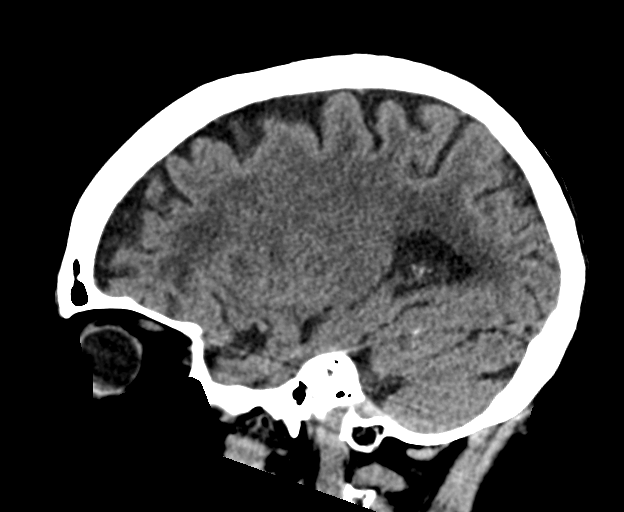
[im 28/56  brain]
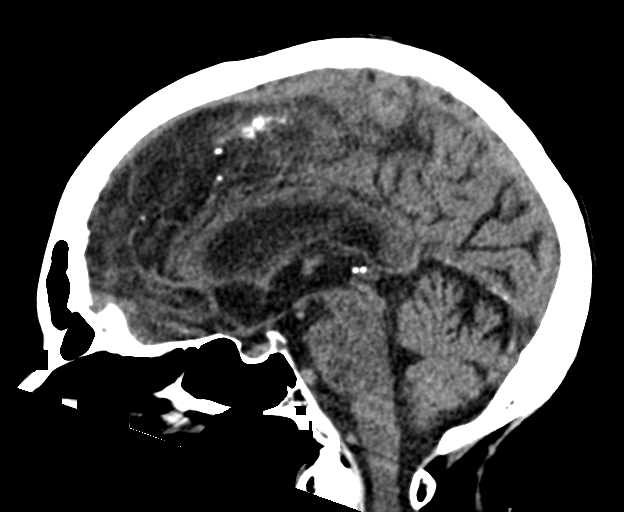
[im 37/56  brain]
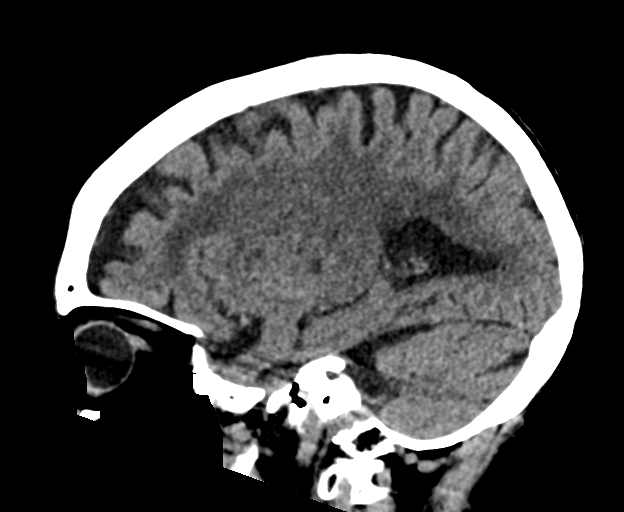

[17 of 47 positions shown; findings below may reference images not displayed]

FINDINGS: Brain: No evidence of acute infarction, hemorrhage, hydrocephalus,
extra-axial collection or mass lesion/mass effect.

There is ventricular and sulcal enlargement consistent with age
appropriate volume loss. Patchy areas of white matter
hypoattenuation are noted bilaterally consistent with moderate
chronic microvascular ischemic change.

Vascular: No hyperdense vessel or unexpected calcification.

Skull: Normal. Negative for fracture or focal lesion.

Sinuses/Orbits: Globes and orbits are unremarkable. Visualized
sinuses are clear.

Other: None.
IMPRESSION: 1. No acute intracranial abnormalities.

## 2022-04-18 IMAGING — DX DG CHEST 1V PORT
1 series · 1 of 1 positions shown · non-contrast
Comparison: Chest radiograph 05/19/2021
COMPARISON: Chest radiograph 05/19/2021

Addendum:
CLINICAL DATA: Screening exam for pulmonary tuberculosis

EXAM:
PORTABLE CHEST 1 VIEW

[chest ap]
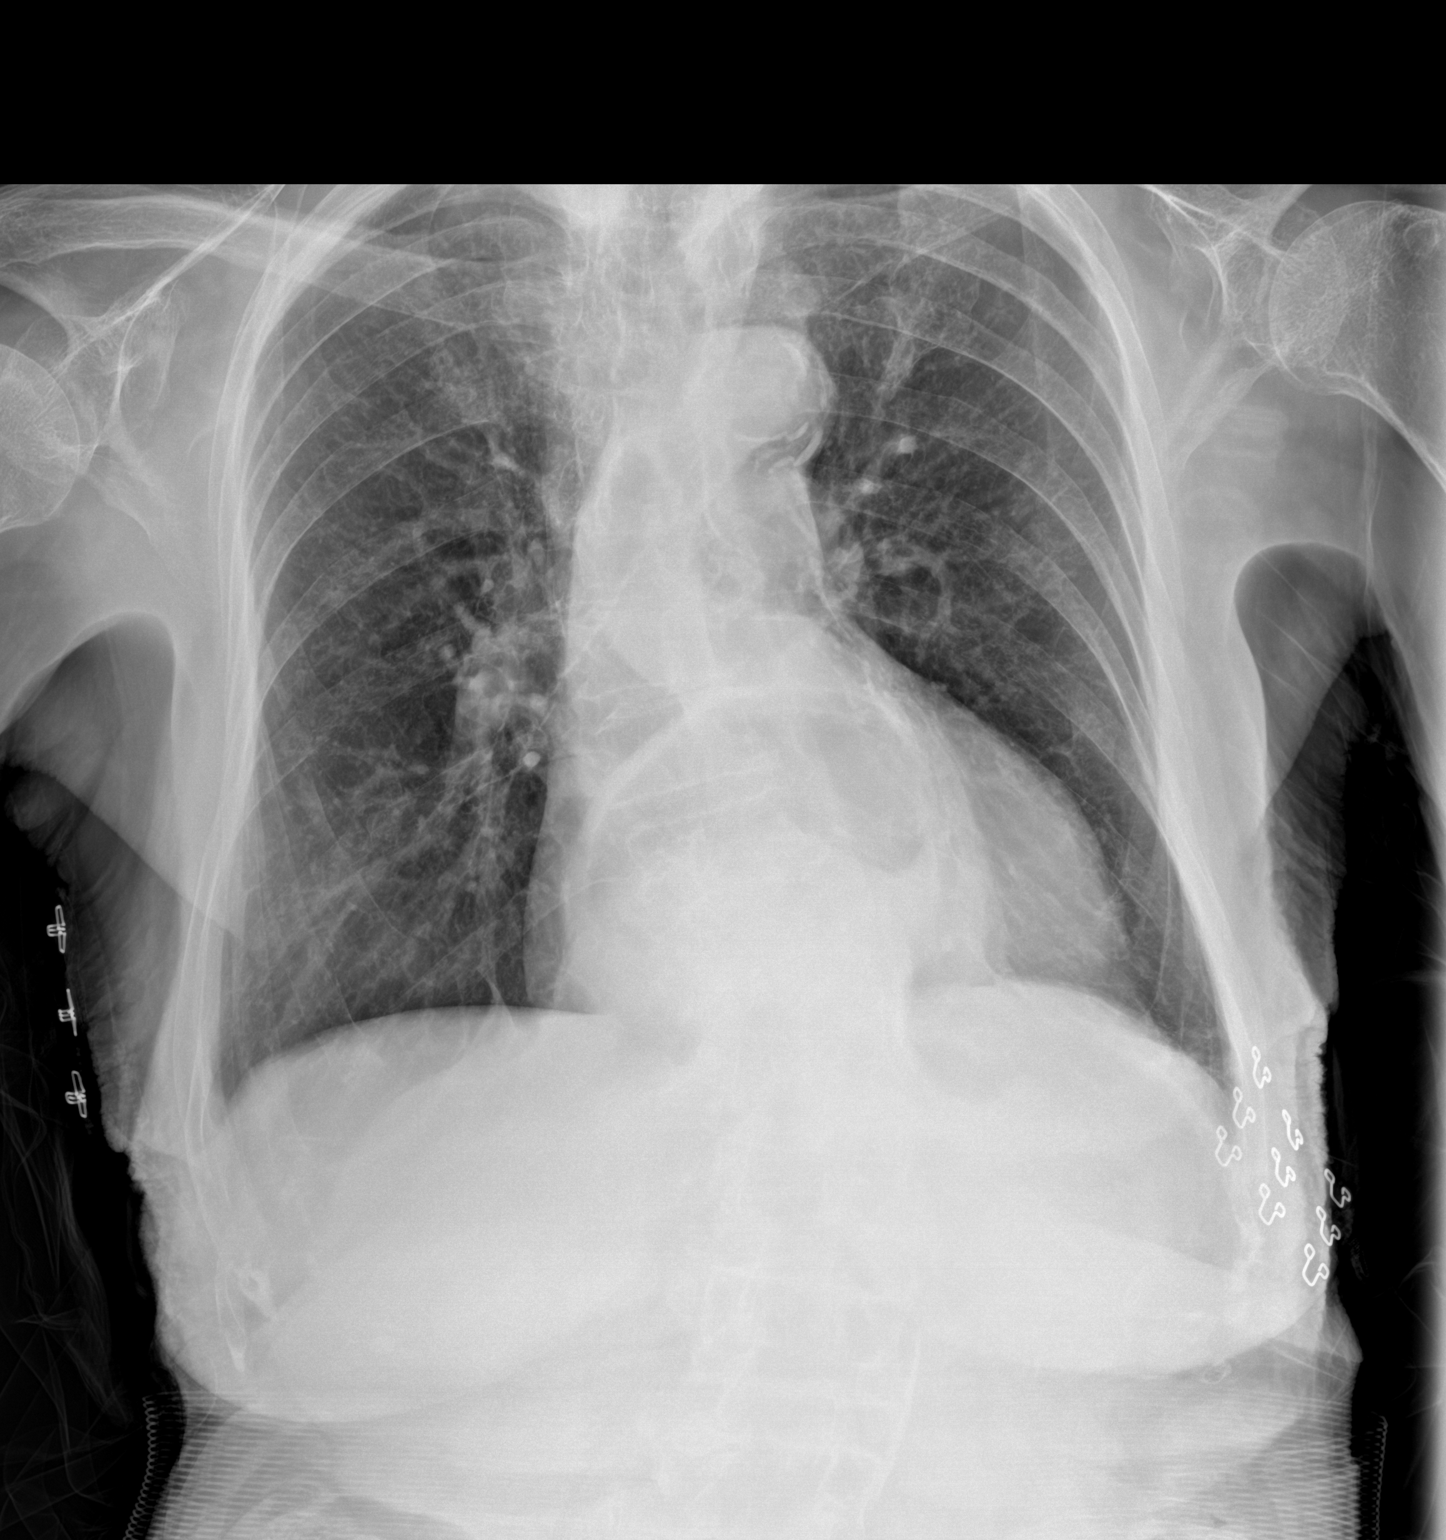

[1 of 1 positions shown; findings below may reference images not displayed]

FINDINGS: The cardiomediastinal silhouette is within normal limits. Aortic
arch calcifications. Large hiatal hernia. There is no focal airspace
consolidation. There is no pleural effusion. No visible
pneumothorax. There is no acute osseous abnormality.
IMPRESSION: No focal airspace disease.

ADDENDUM:
IMPRESSION: No focal airspace disease.  No evidence of active TB.

*** End of Addendum ***
FINDINGS: The cardiomediastinal silhouette is within normal limits. Aortic
arch calcifications. Large hiatal hernia. There is no focal airspace
consolidation. There is no pleural effusion. No visible
pneumothorax. There is no acute osseous abnormality.
IMPRESSION: No focal airspace disease.

## 2022-05-21 ENCOUNTER — Other Ambulatory Visit: Payer: Self-pay

## 2022-05-21 ENCOUNTER — Emergency Department
Admission: EM | Admit: 2022-05-21 | Discharge: 2022-05-21 | Disposition: A | Discharge status: Home / Self Care | Payer: Medicare Other | Attending: Emergency Medicine | Admitting: Emergency Medicine

## 2022-05-21 ENCOUNTER — Emergency Department: Payer: Medicare Other

## 2022-05-21 DIAGNOSIS — M47812 Spondylosis without myelopathy or radiculopathy, cervical region: Secondary | ICD-10-CM | POA: Insufficient documentation

## 2022-05-21 DIAGNOSIS — S42202A Unspecified fracture of upper end of left humerus, initial encounter for closed fracture: Secondary | ICD-10-CM

## 2022-05-21 DIAGNOSIS — I6782 Cerebral ischemia: Secondary | ICD-10-CM | POA: Insufficient documentation

## 2022-05-21 DIAGNOSIS — N189 Chronic kidney disease, unspecified: Secondary | ICD-10-CM | POA: Diagnosis not present

## 2022-05-21 DIAGNOSIS — W01198A Fall on same level from slipping, tripping and stumbling with subsequent striking against other object, initial encounter: Secondary | ICD-10-CM | POA: Insufficient documentation

## 2022-05-21 DIAGNOSIS — W19XXXA Unspecified fall, initial encounter: Secondary | ICD-10-CM

## 2022-05-21 DIAGNOSIS — S4992XA Unspecified injury of left shoulder and upper arm, initial encounter: Secondary | ICD-10-CM | POA: Diagnosis present

## 2022-05-21 DIAGNOSIS — I129 Hypertensive chronic kidney disease with stage 1 through stage 4 chronic kidney disease, or unspecified chronic kidney disease: Secondary | ICD-10-CM | POA: Insufficient documentation

## 2022-05-21 LAB — CBC WITH DIFFERENTIAL/PLATELET
Abs Immature Granulocytes: 0.13 10*3/uL — ABNORMAL HIGH (ref 0.00–0.07)
Basophils Absolute: 0.1 10*3/uL (ref 0.0–0.1)
Basophils Relative: 1 %
Eosinophils Absolute: 0.1 10*3/uL (ref 0.0–0.5)
Eosinophils Relative: 1 %
HCT: 37.1 % (ref 36.0–46.0)
Hemoglobin: 12.1 g/dL (ref 12.0–15.0)
Immature Granulocytes: 1 %
Lymphocytes Relative: 11 %
Lymphs Abs: 1.2 10*3/uL (ref 0.7–4.0)
MCH: 30 pg (ref 26.0–34.0)
MCHC: 32.6 g/dL (ref 30.0–36.0)
MCV: 92.1 fL (ref 80.0–100.0)
Monocytes Absolute: 0.6 10*3/uL (ref 0.1–1.0)
Monocytes Relative: 6 %
Neutro Abs: 8.7 10*3/uL — ABNORMAL HIGH (ref 1.7–7.7)
Neutrophils Relative %: 80 %
Platelets: 423 10*3/uL — ABNORMAL HIGH (ref 150–400)
RBC: 4.03 MIL/uL (ref 3.87–5.11)
RDW: 13.8 % (ref 11.5–15.5)
WBC: 10.8 10*3/uL — ABNORMAL HIGH (ref 4.0–10.5)
nRBC: 0 % (ref 0.0–0.2)

## 2022-05-21 LAB — COMPREHENSIVE METABOLIC PANEL
ALT: 17 U/L (ref 0–44)
AST: 30 U/L (ref 15–41)
Albumin: 3.8 g/dL (ref 3.5–5.0)
Alkaline Phosphatase: 76 U/L (ref 38–126)
Anion gap: 8 (ref 5–15)
BUN: 29 mg/dL — ABNORMAL HIGH (ref 8–23)
CO2: 27 mmol/L (ref 22–32)
Calcium: 9.3 mg/dL (ref 8.9–10.3)
Chloride: 98 mmol/L (ref 98–111)
Creatinine, Ser: 1.06 mg/dL — ABNORMAL HIGH (ref 0.44–1.00)
GFR, Estimated: 48 mL/min — ABNORMAL LOW (ref >60)
Glucose, Bld: 132 mg/dL — ABNORMAL HIGH (ref 70–99)
Potassium: 4.2 mmol/L (ref 3.5–5.1)
Sodium: 133 mmol/L — ABNORMAL LOW (ref 135–145)
Total Bilirubin: 0.5 mg/dL (ref 0.3–1.2)
Total Protein: 7.5 g/dL (ref 6.5–8.1)

## 2022-05-21 MED ORDER — KETOROLAC TROMETHAMINE 30 MG/ML IJ SOLN
15.0000 mg | Freq: Once | INTRAMUSCULAR | Status: AC
  Administered 2022-05-21: 15 mg via INTRAVENOUS
  Filled 2022-05-21: qty 1

## 2022-05-21 MED ORDER — MORPHINE SULFATE (PF) 4 MG/ML IV SOLN
4.0000 mg | Freq: Once | INTRAVENOUS | Status: AC
  Administered 2022-05-21: 4 mg via INTRAVENOUS
  Filled 2022-05-21: qty 1

## 2022-05-21 MED ORDER — HYDROCODONE-ACETAMINOPHEN 5-325 MG PO TABS: 1.0000 | ORAL_TABLET | ORAL | 0 refills | Status: DC | PRN

## 2022-05-21 MED ORDER — ONDANSETRON HCL 4 MG/2ML IJ SOLN
4.0000 mg | Freq: Once | INTRAMUSCULAR | Status: AC
  Administered 2022-05-21: 4 mg via INTRAVENOUS
  Filled 2022-05-21: qty 2

## 2022-05-21 NOTE — ED Triage Notes (Signed)
Patient had unwitnessed fall getting out of chair, complaining of left shoulder pain. Reports hitting head on carpet, no LOC.

## 2022-05-21 NOTE — ED Provider Notes (Signed)
[facility] Provider Note   Event Date/Time   First MD Initiated Contact with Patient 05/21/22 [phone]     (approximate) History  Fall  HPI Suzanne Beltran is a 89 y.o. female with past medical history of hypertension, hyperlipidemia with past medical history of hypertension, hyperlipidemia, and chronic kidney disease who presents after a mechanical fall from standing she states occurred when getting out of her chair and tripping over her own feet.  Patient states that she fell onto the left side striking her head onto a carpeted floor.  Patient denies any loss of consciousness but states that she has been having left upper arm and shoulder pain since the injury. ROS: Patient currently denies any vision changes, tinnitus, difficulty speaking, facial droop, sore throat, chest pain, shortness of breath, abdominal pain, nausea/vomiting/diarrhea, dysuria, or weakness/numbness/paresthesias in any extremity   Physical Exam  Triage Vital Signs: ED Triage Vitals  Enc Vitals Group     BP 05/21/22 0745 (!) 186/76     Pulse Rate 05/21/22 0745 (!) 57     Resp 05/21/22 0745 18     Temp 05/21/22 0745 97.9 F (36.6 C)     Temp Source 05/21/22 0745 Oral     SpO2 05/21/22 0745 98 %     Weight --      Height 05/21/22 0746 5' 4"$  (1.626 m)     Head Circumference --      Peak Flow --      Pain Score 05/21/22 0746 4     Pain Loc --      Pain Edu? --      Excl. in [place]? --    Most recent vital signs: Vitals:   05/21/22 1030 05/21/22 1130  BP: (!) 148/69 (!) 151/70  Pulse: (!) 58 61  Resp:    Temp:    SpO2: 95% 93%   General: Awake, oriented x4. CV:  Good peripheral perfusion.  Resp:  Normal effort.  Abd:  No distention.  Other:  Elderly Caucasian female laying in bed in no acute distress.  Significant tenderness to palpation over the left upper arm ED Results / Procedures / Treatments  Labs (all labs ordered are listed, but only abnormal results are displayed) Labs  Reviewed  COMPREHENSIVE METABOLIC PANEL - Abnormal; Notable for the following components:      Result Value   Sodium 133 (*)    Glucose, Bld 132 (*)    BUN 29 (*)    Creatinine, Ser 1.06 (*)    GFR, Estimated 48 (*)    All other components within normal limits  CBC WITH DIFFERENTIAL/PLATELET - Abnormal; Notable for the following components:   WBC 10.8 (*)    Platelets 423 (*)    Neutro Abs 8.7 (*)    Abs Immature Granulocytes 0.13 (*)    All other components within normal limits   RADIOLOGY ED MD interpretation: CT of the head without contrast interpreted by me shows no evidence of acute abnormalities including no intracerebral hemorrhage, obvious masses, or significant edema  CT of the cervical spine interpreted by me does not show any evidence of acute abnormalities including no acute fracture, malalignment, height loss, or dislocation  X-ray of the left shoulder interpreted independently by me and shows a displaced fracture of the left humeral neck with medial displacement of the main component of the distal fracture fragment at least mild impaction and or overlap of the actual structures at the fracture site -Agree with radiology  assessment Official radiology report(s): CT Head Wo Contrast  Result Date: 05/21/2022 CLINICAL DATA:  Unwitnessed fall getting out of a chair. Left shoulder pain. Head injury on carpet. EXAM: CT HEAD WITHOUT CONTRAST CT CERVICAL SPINE WITHOUT CONTRAST TECHNIQUE: Multidetector CT imaging of the head and cervical spine was performed following the standard protocol without intravenous contrast. Multiplanar CT image reconstructions of the cervical spine were also generated. RADIATION DOSE REDUCTION: This exam was performed according to the departmental dose-optimization program which includes automated exposure control, adjustment of the mA and/or kV according to patient size and/or use of iterative reconstruction technique. COMPARISON:  Head CT 05/19/2021.  FINDINGS: CT HEAD FINDINGS Brain: There is no evidence for acute hemorrhage, hydrocephalus, mass lesion, or abnormal extra-axial fluid collection. No definite CT evidence for acute infarction. Diffuse loss of parenchymal volume is consistent with atrophy. Patchy low attenuation in the deep hemispheric and periventricular white matter is nonspecific, but likely reflects chronic microvascular ischemic demyelination. Vascular: No hyperdense vessel or unexpected calcification. Skull: Normal. Negative for fracture or focal lesion. Sinuses/Orbits: The visualized paranasal sinuses and mastoid air cells are clear. Visualized portions of the globes and intraorbital fat are unremarkable. Other: None. CT CERVICAL SPINE FINDINGS Alignment: Normal. Skull base and vertebrae: No acute fracture. No primary bone lesion or focal pathologic process. Soft tissues and spinal canal: No prevertebral fluid or swelling. No visible canal hematoma. Disc levels: Diffuse loss of disc height is noted in the cervical spine from C2-3 down to C5-6. Facets are well aligned bilaterally. Upper chest: 3 mm left apical nodule seen on image 69 of series 2. Other: None. IMPRESSION: 1. No acute intracranial abnormality. 2. Atrophy with chronic small vessel ischemic disease. 3. No evidence for cervical spine fracture or subluxation. 4. Diffuse degenerative changes in the cervical spine. 5. 3 mm left apical nodule. No follow-up needed if patient is low-risk.This recommendation follows the consensus statement: Guidelines for Management of Incidental Pulmonary Nodules Detected on CT Images: From the Fleischner Society 2017; Radiology 2017; 284:228-243. Electronically Signed   By: Misty Stanley M.D.   On: 05/21/2022 08:32   CT Cervical Spine Wo Contrast  Result Date: 05/21/2022 CLINICAL DATA:  Unwitnessed fall getting out of a chair. Left shoulder pain. Head injury on carpet. EXAM: CT HEAD WITHOUT CONTRAST CT CERVICAL SPINE WITHOUT CONTRAST TECHNIQUE:  Multidetector CT imaging of the head and cervical spine was performed following the standard protocol without intravenous contrast. Multiplanar CT image reconstructions of the cervical spine were also generated. RADIATION DOSE REDUCTION: This exam was performed according to the departmental dose-optimization program which includes automated exposure control, adjustment of the mA and/or kV according to patient size and/or use of iterative reconstruction technique. COMPARISON:  Head CT 05/19/2021. FINDINGS: CT HEAD FINDINGS Brain: There is no evidence for acute hemorrhage, hydrocephalus, mass lesion, or abnormal extra-axial fluid collection. No definite CT evidence for acute infarction. Diffuse loss of parenchymal volume is consistent with atrophy. Patchy low attenuation in the deep hemispheric and periventricular white matter is nonspecific, but likely reflects chronic microvascular ischemic demyelination. Vascular: No hyperdense vessel or unexpected calcification. Skull: Normal. Negative for fracture or focal lesion. Sinuses/Orbits: The visualized paranasal sinuses and mastoid air cells are clear. Visualized portions of the globes and intraorbital fat are unremarkable. Other: None. CT CERVICAL SPINE FINDINGS Alignment: Normal. Skull base and vertebrae: No acute fracture. No primary bone lesion or focal pathologic process. Soft tissues and spinal canal: No prevertebral fluid or swelling. No visible canal hematoma. Disc levels: Diffuse loss  of disc height is noted in the cervical spine from C2-3 down to C5-6. Facets are well aligned bilaterally. Upper chest: 3 mm left apical nodule seen on image 69 of series 2. Other: None. IMPRESSION: 1. No acute intracranial abnormality. 2. Atrophy with chronic small vessel ischemic disease. 3. No evidence for cervical spine fracture or subluxation. 4. Diffuse degenerative changes in the cervical spine. 5. 3 mm left apical nodule. No follow-up needed if patient is low-risk.This  recommendation follows the consensus statement: Guidelines for Management of Incidental Pulmonary Nodules Detected on CT Images: From the Fleischner Society 2017; Radiology 2017; 284:228-243. Electronically Signed   By: Misty Stanley M.D.   On: 05/21/2022 08:32   DG Shoulder Left  Result Date: 05/21/2022 CLINICAL DATA:  Fall, pain, LEFT arm deformity. EXAM: LEFT SHOULDER - 2+ VIEW COMPARISON:  None Available. FINDINGS: Displaced fracture of the LEFT humeral neck, with medial displacement of the main component of the distal fracture fragment and at least mild impaction and/or overlap of osseous structures at the fracture site. Humeral head remains grossly well positioned relative to the glenoid fossa. Surrounding osseous structures of the LEFT shoulder appear intact and normally aligned. Probable large hiatal hernia, incompletely imaged. Probable atelectasis or scarring at the LEFT lung base, incompletely imaged. IMPRESSION: Displaced fracture of the LEFT humeral neck, with medial displacement of the main component of the distal fracture fragment and at least mild impaction and/or overlap of osseous structures at the fracture site. Electronically Signed   By: Franki Cabot M.D.   On: 05/21/2022 08:16   PROCEDURES: Critical Care performed: No .1-3 Lead EKG Interpretation  Performed by: Naaman Plummer, MD Authorized by: Naaman Plummer, MD     Interpretation: normal     ECG rate:  71   ECG rate assessment: normal     Rhythm: sinus rhythm     Ectopy: none     Conduction: normal    MEDICATIONS ORDERED IN ED: Medications  morphine (PF) 4 MG/ML injection 4 mg (4 mg Intravenous Given 05/21/22 0854)  ketorolac (TORADOL) 30 MG/ML injection 15 mg (15 mg Intravenous Given 05/21/22 0854)  ondansetron (ZOFRAN) injection 4 mg (4 mg Intravenous Given 05/21/22 0854)   IMPRESSION / MDM / ASSESSMENT AND PLAN / ED COURSE  I reviewed the triage vital signs and the nursing notes.                             The  patient is on the cardiac monitor to evaluate for evidence of arrhythmia and/or significant heart rate changes. Patient's presentation is most consistent with acute presentation with potential threat to life or bodily function. The Pt was found to have a closed left humerus fracture on XR. The Pt is otherwise well appearing, hemodynamically stable, and shows no evidence of neurovascular injury or compartment syndrome.  I have low suspicion for dislocation, significant ligamentous injury, septic arthritis, gout flare, new autoimmune arthropathy, or gonococcal arthropathy. Patient was placed in sling and swath and will follow up with ortho.    Rx: norco  Dispo: Discharge home with orthopedic follow-up   FINAL CLINICAL IMPRESSION(S) / ED DIAGNOSES   Final diagnoses:  Fall, initial encounter  Closed fracture of proximal end of left humerus, unspecified fracture morphology, initial encounter   Rx / DC Orders   ED Discharge Orders          Ordered    HYDROcodone-acetaminophen (NORCO) 5-325 MG tablet  Every 4 hours PRN        05/21/22 1211           Note:  This document was prepared using Dragon voice recognition software and may include unintentional dictation errors.   Naaman Plummer, MD 05/21/22 1239

## 2022-08-05 ENCOUNTER — Emergency Department

## 2022-08-05 ENCOUNTER — Inpatient Hospital Stay
Admission: EM | Admit: 2022-08-05 | Discharge: 2022-08-09 | DRG: 480 | Disposition: A | Discharge status: Skilled Nursing Facility | Source: Skilled Nursing Facility | Attending: Internal Medicine | Admitting: Internal Medicine

## 2022-08-05 ENCOUNTER — Encounter: Payer: Self-pay | Admitting: Emergency Medicine

## 2022-08-05 ENCOUNTER — Other Ambulatory Visit: Payer: Self-pay

## 2022-08-05 DIAGNOSIS — W1811XA Fall from or off toilet without subsequent striking against object, initial encounter: Secondary | ICD-10-CM | POA: Diagnosis present

## 2022-08-05 DIAGNOSIS — D72829 Elevated white blood cell count, unspecified: Secondary | ICD-10-CM | POA: Diagnosis present

## 2022-08-05 DIAGNOSIS — K219 Gastro-esophageal reflux disease without esophagitis: Secondary | ICD-10-CM | POA: Diagnosis present

## 2022-08-05 DIAGNOSIS — N189 Chronic kidney disease, unspecified: Secondary | ICD-10-CM | POA: Diagnosis present

## 2022-08-05 DIAGNOSIS — D649 Anemia, unspecified: Secondary | ICD-10-CM | POA: Diagnosis not present

## 2022-08-05 DIAGNOSIS — M8580 Other specified disorders of bone density and structure, unspecified site: Secondary | ICD-10-CM | POA: Diagnosis present

## 2022-08-05 DIAGNOSIS — S7290XA Unspecified fracture of unspecified femur, initial encounter for closed fracture: Secondary | ICD-10-CM | POA: Diagnosis present

## 2022-08-05 DIAGNOSIS — Z66 Do not resuscitate: Secondary | ICD-10-CM | POA: Diagnosis present

## 2022-08-05 DIAGNOSIS — F32A Depression, unspecified: Secondary | ICD-10-CM | POA: Diagnosis present

## 2022-08-05 DIAGNOSIS — S72001A Fracture of unspecified part of neck of right femur, initial encounter for closed fracture: Secondary | ICD-10-CM | POA: Diagnosis present

## 2022-08-05 DIAGNOSIS — Z9181 History of falling: Secondary | ICD-10-CM

## 2022-08-05 DIAGNOSIS — N179 Acute kidney failure, unspecified: Secondary | ICD-10-CM | POA: Diagnosis present

## 2022-08-05 DIAGNOSIS — I129 Hypertensive chronic kidney disease with stage 1 through stage 4 chronic kidney disease, or unspecified chronic kidney disease: Secondary | ICD-10-CM | POA: Diagnosis present

## 2022-08-05 DIAGNOSIS — F02C0 Dementia in other diseases classified elsewhere, severe, without behavioral disturbance, psychotic disturbance, mood disturbance, and anxiety: Secondary | ICD-10-CM | POA: Diagnosis present

## 2022-08-05 DIAGNOSIS — Z681 Body mass index (BMI) 19 or less, adult: Secondary | ICD-10-CM | POA: Diagnosis not present

## 2022-08-05 DIAGNOSIS — S62509A Fracture of unspecified phalanx of unspecified thumb, initial encounter for closed fracture: Secondary | ICD-10-CM | POA: Diagnosis present

## 2022-08-05 DIAGNOSIS — D62 Acute posthemorrhagic anemia: Secondary | ICD-10-CM | POA: Diagnosis not present

## 2022-08-05 DIAGNOSIS — E43 Unspecified severe protein-calorie malnutrition: Secondary | ICD-10-CM | POA: Diagnosis present

## 2022-08-05 DIAGNOSIS — D509 Iron deficiency anemia, unspecified: Secondary | ICD-10-CM | POA: Diagnosis present

## 2022-08-05 DIAGNOSIS — G309 Alzheimer's disease, unspecified: Secondary | ICD-10-CM | POA: Diagnosis present

## 2022-08-05 DIAGNOSIS — S62511A Displaced fracture of proximal phalanx of right thumb, initial encounter for closed fracture: Secondary | ICD-10-CM | POA: Diagnosis present

## 2022-08-05 DIAGNOSIS — Z79899 Other long term (current) drug therapy: Secondary | ICD-10-CM | POA: Diagnosis not present

## 2022-08-05 DIAGNOSIS — R4182 Altered mental status, unspecified: Secondary | ICD-10-CM | POA: Diagnosis present

## 2022-08-05 DIAGNOSIS — Y92091 Bathroom in other non-institutional residence as the place of occurrence of the external cause: Secondary | ICD-10-CM | POA: Diagnosis not present

## 2022-08-05 DIAGNOSIS — N1831 Chronic kidney disease, stage 3a: Secondary | ICD-10-CM | POA: Diagnosis present

## 2022-08-05 DIAGNOSIS — R1311 Dysphagia, oral phase: Secondary | ICD-10-CM | POA: Diagnosis present

## 2022-08-05 DIAGNOSIS — S72141A Displaced intertrochanteric fracture of right femur, initial encounter for closed fracture: Principal | ICD-10-CM

## 2022-08-05 DIAGNOSIS — M25551 Pain in right hip: Secondary | ICD-10-CM | POA: Diagnosis present

## 2022-08-05 HISTORY — DX: Chronic kidney disease, unspecified: N18.9

## 2022-08-05 HISTORY — DX: Unspecified dementia, unspecified severity, without behavioral disturbance, psychotic disturbance, mood disturbance, and anxiety: F03.90

## 2022-08-05 HISTORY — DX: Metabolic encephalopathy: G93.41

## 2022-08-05 LAB — CBC WITH DIFFERENTIAL/PLATELET
Abs Immature Granulocytes: 0.79 10*3/uL — ABNORMAL HIGH (ref 0.00–0.07)
Basophils Absolute: 0.1 10*3/uL (ref 0.0–0.1)
Basophils Relative: 1 %
Eosinophils Absolute: 0 10*3/uL (ref 0.0–0.5)
Eosinophils Relative: 0 %
HCT: 30.8 % — ABNORMAL LOW (ref 36.0–46.0)
Hemoglobin: 9.9 g/dL — ABNORMAL LOW (ref 12.0–15.0)
Immature Granulocytes: 5 %
Lymphocytes Relative: 9 %
Lymphs Abs: 1.3 10*3/uL (ref 0.7–4.0)
MCH: 28.9 pg (ref 26.0–34.0)
MCHC: 32.1 g/dL (ref 30.0–36.0)
MCV: 89.8 fL (ref 80.0–100.0)
Monocytes Absolute: 1.3 10*3/uL — ABNORMAL HIGH (ref 0.1–1.0)
Monocytes Relative: 9 %
Neutro Abs: 11.4 10*3/uL — ABNORMAL HIGH (ref 1.7–7.7)
Neutrophils Relative %: 76 %
Platelets: 468 10*3/uL — ABNORMAL HIGH (ref 150–400)
RBC: 3.43 MIL/uL — ABNORMAL LOW (ref 3.87–5.11)
RDW: 15.5 % (ref 11.5–15.5)
WBC: 14.9 10*3/uL — ABNORMAL HIGH (ref 4.0–10.5)
nRBC: 0 % (ref 0.0–0.2)

## 2022-08-05 LAB — SAMPLE TO BLOOD BANK

## 2022-08-05 LAB — PROTIME-INR
INR: 1.1 (ref 0.8–1.2)
Prothrombin Time: 14.2 seconds (ref 11.4–15.2)

## 2022-08-05 LAB — CBC
HCT: 26.9 % — ABNORMAL LOW (ref 36.0–46.0)
Hemoglobin: 8.7 g/dL — ABNORMAL LOW (ref 12.0–15.0)
MCH: 28.8 pg (ref 26.0–34.0)
MCHC: 32.3 g/dL (ref 30.0–36.0)
MCV: 89.1 fL (ref 80.0–100.0)
Platelets: 424 10*3/uL — ABNORMAL HIGH (ref 150–400)
RBC: 3.02 MIL/uL — ABNORMAL LOW (ref 3.87–5.11)
RDW: 15.5 % (ref 11.5–15.5)
WBC: 13 10*3/uL — ABNORMAL HIGH (ref 4.0–10.5)
nRBC: 0 % (ref 0.0–0.2)

## 2022-08-05 LAB — BASIC METABOLIC PANEL
Anion gap: 11 (ref 5–15)
BUN: 41 mg/dL — ABNORMAL HIGH (ref 8–23)
CO2: 18 mmol/L — ABNORMAL LOW (ref 22–32)
Calcium: 8.6 mg/dL — ABNORMAL LOW (ref 8.9–10.3)
Chloride: 112 mmol/L — ABNORMAL HIGH (ref 98–111)
Creatinine, Ser: 1.41 mg/dL — ABNORMAL HIGH (ref 0.44–1.00)
GFR, Estimated: 34 mL/min — ABNORMAL LOW (ref >60)
Glucose, Bld: 123 mg/dL — ABNORMAL HIGH (ref 70–99)
Potassium: 3.6 mmol/L (ref 3.5–5.1)
Sodium: 141 mmol/L (ref 135–145)

## 2022-08-05 LAB — LACTIC ACID, PLASMA
Lactic Acid, Venous: 1.2 mmol/L (ref 0.5–1.9)
Lactic Acid, Venous: 0.8 mmol/L (ref 0.5–1.9)

## 2022-08-05 MED ORDER — FENTANYL CITRATE PF 50 MCG/ML IJ SOSY
50.0000 ug | PREFILLED_SYRINGE | Freq: Once | INTRAMUSCULAR | Status: AC
  Administered 2022-08-05: 50 ug via INTRAVENOUS
  Filled 2022-08-05: qty 1

## 2022-08-05 MED ORDER — ACETAMINOPHEN 10 MG/ML IV SOLN
1000.0000 mg | Freq: Four times a day (QID) | INTRAVENOUS | Status: DC
  Administered 2022-08-05 – 2022-08-06 (×2): 1000 mg via INTRAVENOUS
  Filled 2022-08-05 (×2): qty 100

## 2022-08-05 MED ORDER — TRANEXAMIC ACID-NACL 1000-0.7 MG/100ML-% IV SOLN
1000.0000 mg | Freq: Once | INTRAVENOUS | Status: AC
  Administered 2022-08-06: 1000 mg via INTRAVENOUS
  Filled 2022-08-05: qty 100

## 2022-08-05 MED ORDER — LABETALOL HCL 5 MG/ML IV SOLN: 20.0000 mg | INTRAVENOUS | Status: DC | PRN

## 2022-08-05 MED ORDER — CEFAZOLIN SODIUM-DEXTROSE 1-4 GM/50ML-% IV SOLN
1.0000 g | Freq: Once | INTRAVENOUS | Status: DC
  Filled 2022-08-05: qty 50

## 2022-08-05 MED ORDER — AMLODIPINE BESYLATE 10 MG PO TABS
10.0000 mg | ORAL_TABLET | Freq: Once | ORAL | Status: AC
  Administered 2022-08-05: 10 mg via ORAL
  Filled 2022-08-05: qty 1

## 2022-08-05 MED ORDER — SENNOSIDES-DOCUSATE SODIUM 8.6-50 MG PO TABS
1.0000 | ORAL_TABLET | Freq: Two times a day (BID) | ORAL | Status: DC
  Administered 2022-08-05 – 2022-08-08 (×5): 1 via ORAL
  Filled 2022-08-05 (×6): qty 1

## 2022-08-05 MED ORDER — POLYETHYLENE GLYCOL 3350 17 G PO PACK
17.0000 g | PACK | Freq: Every day | ORAL | Status: DC
  Administered 2022-08-07: 17 g via ORAL
  Filled 2022-08-05: qty 1

## 2022-08-05 MED ORDER — ENOXAPARIN SODIUM 30 MG/0.3ML IJ SOSY: 30.0000 mg | PREFILLED_SYRINGE | INTRAMUSCULAR | Status: DC

## 2022-08-05 MED ORDER — MORPHINE SULFATE (PF) 2 MG/ML IV SOLN
2.0000 mg | INTRAVENOUS | Status: DC | PRN
  Administered 2022-08-05 – 2022-08-06 (×3): 2 mg via INTRAVENOUS
  Filled 2022-08-05 (×3): qty 1

## 2022-08-05 MED ORDER — SODIUM CHLORIDE 0.9% FLUSH
3.0000 mL | Freq: Two times a day (BID) | INTRAVENOUS | Status: DC
  Administered 2022-08-05 – 2022-08-09 (×6): 3 mL via INTRAVENOUS

## 2022-08-05 MED ORDER — HYDRALAZINE HCL 20 MG/ML IJ SOLN: 10.0000 mg | INTRAMUSCULAR | Status: DC | PRN

## 2022-08-05 MED ORDER — SODIUM BICARBONATE 650 MG PO TABS
650.0000 mg | ORAL_TABLET | Freq: Three times a day (TID) | ORAL | Status: DC
  Administered 2022-08-06 – 2022-08-09 (×9): 650 mg via ORAL
  Filled 2022-08-05 (×13): qty 1

## 2022-08-05 MED ORDER — KCL IN DEXTROSE-NACL 20-5-0.45 MEQ/L-%-% IV SOLN
INTRAVENOUS | Status: DC
  Filled 2022-08-05 (×6): qty 1000

## 2022-08-05 NOTE — Progress Notes (Addendum)
Full consult note to follow tomorrow.  Called by ED staff. Imaging reviewed. Patient with R intertrochanteric hip fracture and R thumb base of proximal phalanx fracture. - Plan for surgery tomorrow for R hip IM nail, likely ~12pm. Consent obtained over telephone from patient's daughter who is medical PoA.  - Plan for nonoperative management of R thumb fracture. Recommend thumb spica splint.  - NPO after midnight - Hold anticoagulation - Tranexamic acid 1g IV to be given when available in ED to reduce risk of blood transfusion postoperatively.  - Admit to Hospitalist team.

## 2022-08-05 NOTE — ED Triage Notes (Addendum)
Pt BIB [facility] from [facility], pt had a witnessed fall yesterday while going to the BR with assistance, pt c/o right hip pain with rotation and right arm pain with bruising noted, pt is in memory care but at baseline, v/s en route WNL, denies LOC, hitting head and thinners

## 2022-08-05 NOTE — ED Notes (Signed)
Pt to CT

## 2022-08-05 NOTE — Assessment & Plan Note (Addendum)
This seems to be patient's second fall since February this year.  Seems to be close on my exam.  Will treat pain with acetaminophen and standing as well as oxycodone and morphine as needed.  Lovenox added for DVT prophylaxis. Please see risks of complications perioperatively above from ACS NSQIP analysis. I did discuss the high likelihood (compared to the average population) of postoperative delium/confusion and likely marked (considering tenuous functional status at baseline) decline in functional status. Risk of future fall remains high. I did advise that we will continue with pain medications before and after any procedure. I do not think patient would benefit from further investigations of her cardiac status priro to any procedure. However, I do feel that we need to controll her blood pressure better (that may be elevaed due to pain)

## 2022-08-05 NOTE — Assessment & Plan Note (Signed)
Possibly from pain/fracture. Check blood culture and urinalysis. Lactic acid. At this time no focus of infection. Gentle hdyration. Check cbc at midnight.

## 2022-08-05 NOTE — Assessment & Plan Note (Signed)
Likely worsening from pain, pain meds as well as femur fracture/inflamation. Patient has limited functinoal baseline to begin with. Monitor with pain control . Daughter at bedside to re-orient. I do no think patient can have diet at this time.

## 2022-08-05 NOTE — H&P (Addendum)
History and Physical    Patient: Suzanne Beltran WRU:[id number] DOB: March 30, 1925 DOA: 08/05/2022 DOS: the patient was seen and examined on 08/05/2022 PCP: [name]  Patient coming from: SNF  Chief Complaint:  Chief Complaint  Patient presents with   Fall   HPI: Suzanne Beltran is a 89 y.o. female with medical history significant of dementia with the patient barely able to walk with a walker till 1 week ago. with very limited interaction with family limited to about 1 short sentence at a time.  And requiring total care giving.  Patient apparently suffered a fall that was witnessed from her commode yesterday onto the floor.  Patient was helped back off the floor and an x-ray was attempted to be done as an outpatient.  However patient had a general decline in her function and x-ray could not be done as an outpatient and patient was sent to the ER today.  There is no report of patient having any focal weakness any loss of consciousness or tremor.  Patient's daughter by the name of Philip Aspen is the primary historian at the bedside right now.  Patient has been mostly sleeping in the ER, did receive some morphine for pain control and is currently not interactive at all  Daughter recalls anotehr fall in February complicated by "shoulder fracture". Recent rather preciptous decline in function - stopped walking pretty much in the last 1 week. Review of Systems: unable to review all systems due to the inability of the patient to answer questions. Past Medical History:  Diagnosis Date   CKD (chronic kidney disease)    Dementia (HCC)    Depression    GERD (gastroesophageal reflux disease)    Hypertension    Iron deficiency anemia    Metabolic encephalopathy    Past Surgical History:  Procedure Laterality Date   ABDOMINAL HYSTERECTOMY     Social History:  reports that she has never smoked. She has never used smokeless tobacco. She reports that she does not drink alcohol and  does not use drugs.  No Known Allergies  History reviewed. No pertinent family history.  Prior to Admission medications   Medication Sig Start Date End Date Taking? Authorizing Provider  acetaminophen (TYLENOL) 500 MG tablet Take 500 mg by mouth as needed for mild pain or moderate pain.     [provider]  amLODipine (NORVASC) 10 MG tablet Take 10 mg by mouth daily.    [provider]  atenolol (TENORMIN) 25 MG tablet Take 25 mg by mouth daily.    [provider]  cetirizine (ZYRTEC) 10 MG tablet Take 10 mg by mouth daily as needed for allergies.     [provider]  Cholecalciferol (VITAMIN D3 GUMMIES PO) Take 50 mcg by mouth.    [provider]  ferrous sulfate 325 (65 FE) MG tablet Take 325 mg by mouth daily with breakfast.    [provider]  HYDROcodone-acetaminophen (NORCO) 5-325 MG tablet Take 1 tablet by mouth every 4 (four) hours as needed for moderate pain. 05/21/22   Merwyn Katos, MD  losartan-hydrochlorothiazide (HYZAAR) 50-12.5 MG tablet Take 1 tablet by mouth daily. 10/04/21   [provider]  mirtazapine (REMERON) 15 MG tablet Take 7.5 mg by mouth at bedtime.    [provider]  Multiple Vitamins-Minerals (ICAPS PO) Take 1 capsule by mouth daily.    [provider]  omeprazole (PRILOSEC) 20 MG capsule Take 20 mg by mouth daily as needed (  heartburn).     [provider]  sertraline (ZOLOFT) 50 MG tablet Take 25 mg by mouth daily.    [provider]    Physical Exam: Vitals:   08/05/22 1815 08/05/22 1830 08/05/22 1900 08/05/22 1915  BP: 111/80 (!) 181/156 (!) 265/237 (!) 203/142  Pulse: 84 86 79 77  Resp: (!) 22 20 18 18   Temp:      TempSrc:      SpO2:      Weight:      Height:       General: Patient laying flat in bed, keeps eyes closed.  Gripping with both hands to the side of the rails.  Does not follow direction does not engage in communication Pupils are equal  round patient closed eyes promptly Respiratory exam: Bilateral intravesicular Cardiovascular exam S1-S2 normal soft Abdomen soft nontender Extremities right hip held straight/in neutral position with knee in extension, distal function seems to be intact to slight noxious stimulation.  Other extremities seem to have normal function.  Please note musculoskeletal exam limited because of patient's mentation.  I did not appreciate any other clinical fracture such as of the pelvis.  back could not be examined Right thumb area brusiing. Data Reviewed:  Labs on Admission:  Results for orders placed or performed during the hospital encounter of 08/05/22 (from the past 24 hour(s))  CBC with Differential     Status: Abnormal   Collection Time: 08/05/22  2:25 PM  Result Value Ref Range   WBC 14.9 (H) 4.0 - 10.5 K/uL   RBC 3.43 (L) 3.87 - 5.11 MIL/uL   Hemoglobin 9.9 (L) 12.0 - 15.0 g/dL   HCT 69.6 (L) 29.5 - 28.4 %   MCV 89.8 80.0 - 100.0 fL   MCH 28.9 26.0 - 34.0 pg   MCHC 32.1 30.0 - 36.0 g/dL   RDW 13.2 44.0 - 10.2 %   Platelets 468 (H) 150 - 400 K/uL   nRBC 0.0 0.0 - 0.2 %   Neutrophils Relative % 76 %   Neutro Abs 11.4 (H) 1.7 - 7.7 K/uL   Lymphocytes Relative 9 %   Lymphs Abs 1.3 0.7 - 4.0 K/uL   Monocytes Relative 9 %   Monocytes Absolute 1.3 (H) 0.1 - 1.0 K/uL   Eosinophils Relative 0 %   Eosinophils Absolute 0.0 0.0 - 0.5 K/uL   Basophils Relative 1 %   Basophils Absolute 0.1 0.0 - 0.1 K/uL   Immature Granulocytes 5 %   Abs Immature Granulocytes 0.79 (H) 0.00 - 0.07 K/uL  Basic metabolic panel     Status: Abnormal   Collection Time: 08/05/22  2:25 PM  Result Value Ref Range   Sodium 141 135 - 145 mmol/L   Potassium 3.6 3.5 - 5.1 mmol/L   Chloride 112 (H) 98 - 111 mmol/L   CO2 18 (L) 22 - 32 mmol/L   Glucose, Bld 123 (H) 70 - 99 mg/dL   BUN 41 (H) 8 - 23 mg/dL   Creatinine, Ser 7.25 (H) 0.44 - 1.00 mg/dL   Calcium 8.6 (L) 8.9 - 10.3 mg/dL   GFR, Estimated 34 (L) >60 mL/min    Anion gap 11 5 - 15  Sample to Blood Bank     Status: None   Collection Time: 08/05/22  2:26 PM  Result Value Ref Range   Blood Bank Specimen      PPID FORM MISSING PATIENT LABEL ON RIGHT SIDE OF THE FORM NOTIFIED ASHLEY TREXLER AT 1448 08/05/22  DAS   Sample Expiration      08/08/2022,2359 Performed at [facility], [address] [place] [place], [place] [address]   Protime-INR     Status: None   Collection Time: 08/05/22  2:26 PM  Result Value Ref Range   Prothrombin Time 14.2 11.4 - 15.2 seconds   INR 1.1 0.8 - 1.2   Basic Metabolic Panel: Radiological Exams on Admission:  DG Hand Complete Right  Result Date: 08/05/2022 CLINICAL DATA:  Fall EXAM: RIGHT HAND - COMPLETE 3 VIEW COMPARISON:  None Available. FINDINGS: Comminuted fracture of the base of the proximal phalanx of the thumb, possibly subacute or chronic. No evidence of dislocation. Advanced degenerative changes of the thumb CMC and MCP joints. Scattered moderate degenerative changes of the IP joints. Diffuse demineralization. Comminuted fracture of the base of the proximal phalanx of the thumb. Additional chronic appearing osseous deformities of the proximal fourth and fifth metacarpals. Soft tissues are unremarkable. IMPRESSION: 1. Comminuted fracture of the base of the proximal phalanx of the thumb, possibly subacute or chronic. Correlate for point tenderness. 2. Additional chronic appearing osseous deformities of the proximal fourth and fifth metacarpals. 3. Advanced degenerative changes of the thumb CMC and MCP joints. Electronically Signed   By: Allegra Lai M.D.   On: 08/05/2022 16:43   DG Chest 1 View  Result Date: 08/05/2022 CLINICAL DATA:  Pain after fall EXAM: CHEST  1 VIEW COMPARISON:  Left shoulder x-ray 05/21/2022.  Chest x-ray 05/24/2021 FINDINGS: Film is rotated to the left. Stable cardiopericardial silhouette with a calcified aorta. Large hiatal hernia. No consolidation, pneumothorax, effusion or edema.  Mild left basilar atelectasis. Known comminuted proximal humeral fracture on the left. Overlapping cardiac leads IMPRESSION: Hyperinflation.  Mild left basilar atelectasis. Rotated radiograph with enlarged heart.  Calcified aorta. Hiatal hernia. Electronically Signed   By: Karen Kays M.D.   On: 08/05/2022 16:40   DG Hip Unilat  With Pelvis 2-3 Views Right  Result Date: 08/05/2022 CLINICAL DATA:  Pain after fall EXAM: DG HIP (WITH OR WITHOUT PELVIS) 3V RIGHT COMPARISON:  X-ray 08/23/2020 FINDINGS: Osteopenia. Comminuted fracture of the right hip at the intertrochanteric region. Some angulation and displacement. There is deformity of the pubic bones bilaterally. The along the inferior pubic ramus on the right is stable. Left are new from the study of 2022. Please correlate for any known history. Global degenerative changes with osteophyte formation joint space loss particularly of the left hip. Scattered colonic stool. Degenerative changes of the sacroiliac joints. IMPRESSION: Displaced comminuted intertrochanteric fracture of the right hip with severe osteopenia. More chronic appearing deformity of the pubic bones. Those on the left were not seen on the study of 2022. Please correlate with history. Scattered degenerative changes seen particularly of the left hip Electronically Signed   By: Karen Kays M.D.   On: 08/05/2022 16:38   CT Cervical Spine Wo Contrast  Result Date: 08/05/2022 CLINICAL DATA:  Witnessed fall yesterday EXAM: CT CERVICAL SPINE WITHOUT CONTRAST TECHNIQUE: Multidetector CT imaging of the cervical spine was performed without intravenous contrast. Multiplanar CT image reconstructions were also generated. RADIATION DOSE REDUCTION: This exam was performed according to the departmental dose-optimization program which includes automated exposure control, adjustment of the mA and/or kV according to patient size and/or use of iterative reconstruction technique. COMPARISON:  05/21/2022 FINDINGS:  Alignment: Alignment is anatomic. Skull base and vertebrae: No acute fracture. No primary bone lesion or focal pathologic process. Soft tissues and spinal canal: No prevertebral fluid or swelling.  No visible canal hematoma. Disc levels: Mild diffuse cervical spondylosis most pronounced at C3-4, C4-5, and C5-6. No change since prior exam. Upper chest: Airway is patent. Stable 3 mm left apical nodule. No specific imaging follow-up is recommended if the patient is low risk. Other: Reconstructed images demonstrate no additional findings. IMPRESSION: 1. Stable cervical spine.  No acute fracture. Electronically Signed   By: Sharlet Salina M.D.   On: 08/05/2022 16:23   CT Head Wo Contrast  Result Date: 08/05/2022 CLINICAL DATA:  Witnessed fall yesterday EXAM: CT HEAD WITHOUT CONTRAST TECHNIQUE: Contiguous axial images were obtained from the base of the skull through the vertex without intravenous contrast. RADIATION DOSE REDUCTION: This exam was performed according to the departmental dose-optimization program which includes automated exposure control, adjustment of the mA and/or kV according to patient size and/or use of iterative reconstruction technique. COMPARISON:  05/21/2022 FINDINGS: Brain: Stable chronic small-vessel ischemic changes throughout the periventricular white matter. No evidence of acute infarct or hemorrhage. Lateral ventricles and midline structures are unremarkable. No acute extra-axial fluid collections. No mass effect. Vascular: Stable atherosclerosis.  No hyperdense vessel. Skull: Normal. Negative for fracture or focal lesion. Sinuses/Orbits: No acute finding. Other: None. IMPRESSION: 1. Stable head CT, no acute intracranial process. Electronically Signed   By: Sharlet Salina M.D.   On: 08/05/2022 16:21    EKG: Independently reviewed. NSR  ACS NSQIP analysis   Assessment and Plan: * Femur fracture (HCC) This seems to be patient's second fall since February this year.  Seems to be close  on my exam.  Will treat pain with acetaminophen and standing as well as oxycodone and morphine as needed.  Lovenox added for DVT prophylaxis. Please see risks of complications perioperatively above from ACS NSQIP analysis. I did discuss the high likelihood (compared to the average population) of postoperative delium/confusion and likely marked (considering tenuous functional status at baseline) decline in functional status. Risk of future fall remains high. I did advise that we will continue with pain medications before and after any procedure. I do not think patient would benefit from further investigations of her cardiac status priro to currently anticipated procedure. However, I do feel that we need to controll her blood pressure better (that may be elevated due to pain)    Thumb fracture No - operative rx. See surgery note.  Leukocytosis Possibly from pain/fracture. Check blood culture and urinalysis. Lactic acid. At this time no focus of infection. Gentle hdyration. Check cbc at midnight.  AMS (altered mental status) Likely worsening from pain, pain meds as well as femur fracture/inflamation. Patient has limited functinoal baseline to begin with. Monitor with pain control . Daughter at bedside to re-orient. I do no think patient can have diet at this time.      Advance Care Planning:   Code Status: DNR per duaghter at bedside who is HCP  Consults: orthopedic surgery.  Family Communication: duaghter at bedside.  Severity of Illness: The appropriate patient status for this patient is INPATIENT. Inpatient status is judged to be reasonable and necessary in order to provide the required intensity of service to ensure the patient's safety. The patient's presenting symptoms, physical exam findings, and initial radiographic and laboratory data in the context of their chronic comorbidities is felt to place them at high risk for further clinical deterioration. Furthermore, it is not anticipated  that the patient will be medically stable for discharge from the hospital within 2 midnights of admission.   * I certify that at the  point of admission it is my clinical judgment that the patient will require inpatient hospital care spanning beyond 2 midnights from the point of admission due to high intensity of service, high risk for further deterioration and high frequency of surveillance required.*  Author: Nolberto Hanlon, MD 08/05/2022 7:51 PM  For on call review [email or web address].

## 2022-08-05 NOTE — Assessment & Plan Note (Signed)
No - operative rx. See surgery note.

## 2022-08-05 NOTE — ED Provider Notes (Signed)
[facility] Provider Note    Event Date/Time   First MD Initiated Contact with Patient 08/05/22 1415     (approximate)   History   Fall   HPI  Suzanne Beltran is a 89 y.o. female who presents to the emergency department today because of concerns for right hip pain after a fall.  The patient does have history of dementia and cannot give any significant history.  Apparently she had a fall yesterday when she was using the restroom.  The patient was then found to be complaining of right hip pain today.  Per report patient did not hit her head.     Physical Exam   Triage Vital Signs: ED Triage Vitals  Enc Vitals Group     BP 08/05/22 1412 (!) 167/136     Pulse Rate 08/05/22 1412 78     Resp 08/05/22 1412 19     Temp 08/05/22 1412 98.2 F (36.8 C)     Temp Source 08/05/22 1412 Axillary     SpO2 08/05/22 1412 99 %     Weight 08/05/22 1410 103 lb (46.7 kg)     Height 08/05/22 1410 5\' 4"  (1.626 m)     Head Circumference --      Peak Flow --      Pain Score --      Pain Loc --      Pain Edu? --      Excl. in GC? --     Most recent vital signs: Vitals:   08/05/22 1415 08/05/22 1430  BP: (!) 167/136 (!) 222/194  Pulse: 76 71  Resp: 19 13  Temp:    SpO2:     General: Awake, alert, oriented. CV:  Good peripheral perfusion. Regular rate and rhythm. Resp:  Normal effort. Lungs clear. Abd:  No distention.  Ext:  Right hip tender to palpation and manipulation. Right hand with bruising around the thumb.    ED Results / Procedures / Treatments   Labs (all labs ordered are listed, but only abnormal results are displayed) Labs Reviewed  CBC WITH DIFFERENTIAL/PLATELET - Abnormal; Notable for the following components:      Result Value   WBC 14.9 (*)    RBC 3.43 (*)    Hemoglobin 9.9 (*)    HCT 30.8 (*)    Platelets 468 (*)    Neutro Abs 11.4 (*)    Monocytes Absolute 1.3 (*)    Abs Immature Granulocytes 0.79 (*)    All other components within  normal limits  BASIC METABOLIC PANEL - Abnormal; Notable for the following components:   Chloride 112 (*)    CO2 18 (*)    Glucose, Bld 123 (*)    BUN 41 (*)    Creatinine, Ser 1.41 (*)    Calcium 8.6 (*)    GFR, Estimated 34 (*)    All other components within normal limits  SAMPLE TO BLOOD BANK     EKG  I, Phineas Semen, attending physician, personally viewed and interpreted this EKG  EKG Time: 1414 Rate: 72 Rhythm: sinus rhythm Axis: normal Intervals: qtc 440 QRS: IVCD ST changes: no st elevation Impression: abnormal ekg    RADIOLOGY Imaging pending at time of signout    PROCEDURES:  Critical Care performed: No   MEDICATIONS ORDERED IN ED: Medications - No data to display   IMPRESSION / MDM / ASSESSMENT AND PLAN / ED COURSE  I reviewed the triage vital signs and the  nursing notes.                              Differential diagnosis includes, but is not limited to, hip fracture, hip dislocation, contusion, hand fracture.  Patient's presentation is most consistent with acute presentation with potential threat to life or bodily function.   The patient is on the cardiac monitor to evaluate for evidence of arrhythmia and/or significant heart rate changes.  Patient presented to the emergency department today because of concerns for right hip pain after a fall yesterday.  On exam patient is tender to manipulation and palpation of the right hip.  Also has some bruising around her right thumb.  Will obtain x-rays.  Awaiting x-rays at time of signout.      FINAL CLINICAL IMPRESSION(S) / ED DIAGNOSES   Right hip pain    Note:  This document was prepared using Dragon voice recognition software and may include unintentional dictation errors.     Phineas Semen, MD 08/06/22 [phone]

## 2022-08-05 NOTE — Progress Notes (Signed)
[facility]- Civil engineer, contracting [facility])       This patient is a current hospice patient with [facility], admitted --with a terminal diagnosis of Alzheimer's Disease.     [facility] will continue to follow for any discharge planning needs and to coordinate continuation of hospice care.   Please don't hesitate to call with any Hospice related questions or concerns.    Thank you for the opportunity to participate in this patient's care  [facility], [facility] Liaison [phone]

## 2022-08-06 ENCOUNTER — Encounter
Admission: EM | Disposition: A | Discharge status: Skilled Nursing Facility | Payer: Self-pay | Source: Skilled Nursing Facility | Attending: Internal Medicine

## 2022-08-06 ENCOUNTER — Encounter: Payer: Self-pay | Admitting: Internal Medicine

## 2022-08-06 ENCOUNTER — Inpatient Hospital Stay: Admitting: Certified Registered Nurse Anesthetist

## 2022-08-06 ENCOUNTER — Other Ambulatory Visit: Payer: Self-pay

## 2022-08-06 ENCOUNTER — Inpatient Hospital Stay

## 2022-08-06 DIAGNOSIS — S72001A Fracture of unspecified part of neck of right femur, initial encounter for closed fracture: Secondary | ICD-10-CM | POA: Diagnosis not present

## 2022-08-06 HISTORY — PX: INTRAMEDULLARY (IM) NAIL INTERTROCHANTERIC: SHX5875

## 2022-08-06 LAB — URINALYSIS, COMPLETE (UACMP) WITH MICROSCOPIC
Bacteria, UA: NONE SEEN
Bilirubin Urine: NEGATIVE
Glucose, UA: NEGATIVE mg/dL
Hgb urine dipstick: NEGATIVE
Ketones, ur: NEGATIVE mg/dL
Leukocytes,Ua: NEGATIVE
Nitrite: NEGATIVE
Protein, ur: 100 mg/dL — AB
Specific Gravity, Urine: 1.027 (ref 1.005–1.030)
pH: 5 (ref 5.0–8.0)

## 2022-08-06 LAB — VITAMIN D 25 HYDROXY (VIT D DEFICIENCY, FRACTURES): Vit D, 25-Hydroxy: 53.5 ng/mL (ref 30–100)

## 2022-08-06 LAB — CBC
HCT: 27.1 % — ABNORMAL LOW (ref 36.0–46.0)
Hemoglobin: 8.7 g/dL — ABNORMAL LOW (ref 12.0–15.0)
MCH: 28.3 pg (ref 26.0–34.0)
MCHC: 32.1 g/dL (ref 30.0–36.0)
MCV: 88.3 fL (ref 80.0–100.0)
Platelets: 417 10*3/uL — ABNORMAL HIGH (ref 150–400)
RBC: 3.07 MIL/uL — ABNORMAL LOW (ref 3.87–5.11)
RDW: 15.8 % — ABNORMAL HIGH (ref 11.5–15.5)
WBC: 13.1 10*3/uL — ABNORMAL HIGH (ref 4.0–10.5)
nRBC: 0 % (ref 0.0–0.2)

## 2022-08-06 LAB — PROTIME-INR
INR: 1.3 — ABNORMAL HIGH (ref 0.8–1.2)
Prothrombin Time: 16.2 seconds — ABNORMAL HIGH (ref 11.4–15.2)

## 2022-08-06 LAB — SURGICAL PCR SCREEN
MRSA, PCR: NEGATIVE
Staphylococcus aureus: NEGATIVE

## 2022-08-06 LAB — BASIC METABOLIC PANEL
Anion gap: 7 (ref 5–15)
BUN: 41 mg/dL — ABNORMAL HIGH (ref 8–23)
CO2: 20 mmol/L — ABNORMAL LOW (ref 22–32)
Calcium: 8.6 mg/dL — ABNORMAL LOW (ref 8.9–10.3)
Chloride: 113 mmol/L — ABNORMAL HIGH (ref 98–111)
Creatinine, Ser: 1.34 mg/dL — ABNORMAL HIGH (ref 0.44–1.00)
GFR, Estimated: 37 mL/min — ABNORMAL LOW (ref >60)
Glucose, Bld: 130 mg/dL — ABNORMAL HIGH (ref 70–99)
Potassium: 3.7 mmol/L (ref 3.5–5.1)
Sodium: 140 mmol/L (ref 135–145)

## 2022-08-06 LAB — TYPE AND SCREEN
ABO/RH(D): O POS
Antibody Screen: NEGATIVE

## 2022-08-06 LAB — CULTURE, BLOOD (ROUTINE X 2): Culture: NO GROWTH

## 2022-08-06 LAB — APTT: aPTT: 32 seconds (ref 24–36)

## 2022-08-06 SURGERY — FIXATION, FRACTURE, INTERTROCHANTERIC, WITH INTRAMEDULLARY ROD
Anesthesia: General | Site: Hip | Laterality: Right

## 2022-08-06 MED ORDER — BUPIVACAINE LIPOSOME 1.3 % IJ SUSP
INTRAMUSCULAR | Status: AC
  Filled 2022-08-06: qty 20

## 2022-08-06 MED ORDER — POLYVINYL ALCOHOL 1.4 % OP SOLN
1.0000 [drp] | Freq: Two times a day (BID) | OPHTHALMIC | Status: DC
  Administered 2022-08-06 – 2022-08-09 (×6): 1 [drp] via OPHTHALMIC
  Filled 2022-08-06 (×2): qty 15

## 2022-08-06 MED ORDER — SUCCINYLCHOLINE CHLORIDE 200 MG/10ML IV SOSY
PREFILLED_SYRINGE | INTRAVENOUS | Status: DC | PRN
  Administered 2022-08-06: 4 mg via INTRAVENOUS

## 2022-08-06 MED ORDER — CEFAZOLIN SODIUM-DEXTROSE 1-4 GM/50ML-% IV SOLN
INTRAVENOUS | Status: DC | PRN
  Administered 2022-08-06: 1 g via INTRAVENOUS

## 2022-08-06 MED ORDER — HEPARIN SODIUM (PORCINE) 5000 UNIT/ML IJ SOLN
5000.0000 [IU] | Freq: Three times a day (TID) | INTRAMUSCULAR | Status: DC
  Administered 2022-08-06 – 2022-08-07 (×3): 5000 [IU] via SUBCUTANEOUS
  Filled 2022-08-06 (×3): qty 1

## 2022-08-06 MED ORDER — PANTOPRAZOLE SODIUM 40 MG PO TBEC
40.0000 mg | DELAYED_RELEASE_TABLET | Freq: Every day | ORAL | Status: DC
  Administered 2022-08-07 – 2022-08-09 (×3): 40 mg via ORAL
  Filled 2022-08-06 (×3): qty 1

## 2022-08-06 MED ORDER — HYDROMORPHONE HCL 1 MG/ML IJ SOLN: 0.2000 mg | INTRAMUSCULAR | Status: DC | PRN

## 2022-08-06 MED ORDER — AMLODIPINE BESYLATE 10 MG PO TABS
10.0000 mg | ORAL_TABLET | Freq: Every day | ORAL | Status: DC
  Administered 2022-08-08 – 2022-08-09 (×2): 10 mg via ORAL
  Filled 2022-08-06 (×3): qty 1

## 2022-08-06 MED ORDER — DEXAMETHASONE SODIUM PHOSPHATE 10 MG/ML IJ SOLN
INTRAMUSCULAR | Status: AC
  Filled 2022-08-06: qty 1

## 2022-08-06 MED ORDER — LACTATED RINGERS IV SOLN: INTRAVENOUS | Status: DC

## 2022-08-06 MED ORDER — FENTANYL CITRATE (PF) 100 MCG/2ML IJ SOLN
INTRAMUSCULAR | Status: DC | PRN
  Administered 2022-08-06 (×2): 25 ug via INTRAVENOUS

## 2022-08-06 MED ORDER — TRAMADOL HCL 50 MG PO TABS: 50.0000 mg | ORAL_TABLET | Freq: Four times a day (QID) | ORAL | Status: DC | PRN

## 2022-08-06 MED ORDER — BUSPIRONE HCL 10 MG PO TABS
5.0000 mg | ORAL_TABLET | Freq: Two times a day (BID) | ORAL | Status: DC
  Administered 2022-08-06 – 2022-08-09 (×6): 5 mg via ORAL
  Filled 2022-08-06 (×6): qty 1

## 2022-08-06 MED ORDER — KETOROLAC TROMETHAMINE 15 MG/ML IJ SOLN
7.5000 mg | Freq: Four times a day (QID) | INTRAMUSCULAR | Status: AC
  Administered 2022-08-06 – 2022-08-07 (×4): 7.5 mg via INTRAVENOUS
  Filled 2022-08-06 (×4): qty 1

## 2022-08-06 MED ORDER — NEOMYCIN-POLYMYXIN B GU 40-200000 IR SOLN
Status: AC
  Filled 2022-08-06: qty 20

## 2022-08-06 MED ORDER — PHENYLEPHRINE HCL-NACL 20-0.9 MG/250ML-% IV SOLN
INTRAVENOUS | Status: DC | PRN
  Administered 2022-08-06: 50 ug/min via INTRAVENOUS

## 2022-08-06 MED ORDER — METOCLOPRAMIDE HCL 5 MG PO TABS: 5.0000 mg | ORAL_TABLET | Freq: Three times a day (TID) | ORAL | Status: DC | PRN

## 2022-08-06 MED ORDER — SODIUM CHLORIDE 0.9 % IV SOLN: INTRAVENOUS | Status: DC

## 2022-08-06 MED ORDER — METOCLOPRAMIDE HCL 5 MG/ML IJ SOLN: 5.0000 mg | Freq: Three times a day (TID) | INTRAMUSCULAR | Status: DC | PRN

## 2022-08-06 MED ORDER — ONDANSETRON HCL 4 MG/2ML IJ SOLN: 4.0000 mg | Freq: Four times a day (QID) | INTRAMUSCULAR | Status: DC | PRN

## 2022-08-06 MED ORDER — ROCURONIUM BROMIDE 100 MG/10ML IV SOLN
INTRAVENOUS | Status: DC | PRN
  Administered 2022-08-06: 10 mg via INTRAVENOUS
  Administered 2022-08-06: 30 mg via INTRAVENOUS

## 2022-08-06 MED ORDER — LIDOCAINE HCL (CARDIAC) PF 100 MG/5ML IV SOSY
PREFILLED_SYRINGE | INTRAVENOUS | Status: DC | PRN
  Administered 2022-08-06: 40 mg via INTRAVENOUS

## 2022-08-06 MED ORDER — LACTATED RINGERS IV SOLN: INTRAVENOUS | Status: DC | PRN

## 2022-08-06 MED ORDER — PHENYLEPHRINE HCL (PRESSORS) 10 MG/ML IV SOLN
INTRAVENOUS | Status: DC | PRN
  Administered 2022-08-06: 80 ug via INTRAVENOUS

## 2022-08-06 MED ORDER — ONDANSETRON HCL 4 MG PO TABS: 4.0000 mg | ORAL_TABLET | Freq: Four times a day (QID) | ORAL | Status: DC | PRN

## 2022-08-06 MED ORDER — DEXAMETHASONE SODIUM PHOSPHATE 10 MG/ML IJ SOLN
INTRAMUSCULAR | Status: DC | PRN
  Administered 2022-08-06: 5 mg via INTRAVENOUS

## 2022-08-06 MED ORDER — EPHEDRINE SULFATE (PRESSORS) 50 MG/ML IJ SOLN
INTRAMUSCULAR | Status: DC | PRN
  Administered 2022-08-06: 5 mg via INTRAVENOUS

## 2022-08-06 MED ORDER — QUETIAPINE FUMARATE 25 MG PO TABS
25.0000 mg | ORAL_TABLET | Freq: Two times a day (BID) | ORAL | Status: DC
  Administered 2022-08-06 – 2022-08-09 (×6): 25 mg via ORAL
  Filled 2022-08-06 (×6): qty 1

## 2022-08-06 MED ORDER — ACETAMINOPHEN 10 MG/ML IV SOLN: 15.0000 mg/kg | Freq: Once | INTRAVENOUS | Status: DC | PRN

## 2022-08-06 MED ORDER — METHOCARBAMOL 1000 MG/10ML IJ SOLN: 500.0000 mg | Freq: Four times a day (QID) | INTRAVENOUS | Status: DC | PRN

## 2022-08-06 MED ORDER — SUGAMMADEX SODIUM 200 MG/2ML IV SOLN
INTRAVENOUS | Status: DC | PRN
  Administered 2022-08-06: 93.4 mg via INTRAVENOUS

## 2022-08-06 MED ORDER — ONDANSETRON HCL 4 MG/2ML IJ SOLN
INTRAMUSCULAR | Status: AC
  Filled 2022-08-06: qty 2

## 2022-08-06 MED ORDER — SERTRALINE HCL 50 MG PO TABS
100.0000 mg | ORAL_TABLET | Freq: Every day | ORAL | Status: DC
  Administered 2022-08-07 – 2022-08-09 (×3): 100 mg via ORAL
  Filled 2022-08-06 (×3): qty 2

## 2022-08-06 MED ORDER — PROPOFOL 10 MG/ML IV BOLUS
INTRAVENOUS | Status: AC
  Filled 2022-08-06: qty 20

## 2022-08-06 MED ORDER — OXYCODONE HCL 5 MG PO TABS: 5.0000 mg | ORAL_TABLET | Freq: Once | ORAL | Status: DC | PRN

## 2022-08-06 MED ORDER — ACETAMINOPHEN 500 MG PO TABS
1000.0000 mg | ORAL_TABLET | Freq: Three times a day (TID) | ORAL | Status: DC
  Administered 2022-08-06 – 2022-08-09 (×8): 1000 mg via ORAL
  Filled 2022-08-06 (×9): qty 2

## 2022-08-06 MED ORDER — PHENYLEPHRINE HCL-NACL 20-0.9 MG/250ML-% IV SOLN
INTRAVENOUS | Status: AC
  Filled 2022-08-06: qty 250

## 2022-08-06 MED ORDER — CEFAZOLIN SODIUM-DEXTROSE 1-4 GM/50ML-% IV SOLN
1.0000 g | Freq: Four times a day (QID) | INTRAVENOUS | Status: AC
  Administered 2022-08-06 – 2022-08-07 (×3): 1 g via INTRAVENOUS
  Filled 2022-08-06 (×3): qty 50

## 2022-08-06 MED ORDER — FENTANYL CITRATE (PF) 100 MCG/2ML IJ SOLN
INTRAMUSCULAR | Status: AC
  Filled 2022-08-06: qty 2

## 2022-08-06 MED ORDER — 0.9 % SODIUM CHLORIDE (POUR BTL) OPTIME
TOPICAL | Status: DC | PRN
  Administered 2022-08-06: 500 mL

## 2022-08-06 MED ORDER — FLEET ENEMA 7-19 GM/118ML RE ENEM: 1.0000 | ENEMA | Freq: Once | RECTAL | Status: DC | PRN

## 2022-08-06 MED ORDER — PROPOFOL 10 MG/ML IV BOLUS
INTRAVENOUS | Status: DC | PRN
  Administered 2022-08-06: 80 mg via INTRAVENOUS

## 2022-08-06 MED ORDER — FENTANYL CITRATE (PF) 100 MCG/2ML IJ SOLN: 25.0000 ug | INTRAMUSCULAR | Status: DC | PRN

## 2022-08-06 MED ORDER — ONDANSETRON HCL 4 MG/2ML IJ SOLN: 4.0000 mg | Freq: Once | INTRAMUSCULAR | Status: DC | PRN

## 2022-08-06 MED ORDER — DOCUSATE SODIUM 100 MG PO CAPS
100.0000 mg | ORAL_CAPSULE | Freq: Two times a day (BID) | ORAL | Status: DC
  Administered 2022-08-06 – 2022-08-08 (×4): 100 mg via ORAL
  Filled 2022-08-06 (×6): qty 1

## 2022-08-06 MED ORDER — LIDOCAINE HCL (PF) 2 % IJ SOLN
INTRAMUSCULAR | Status: AC
  Filled 2022-08-06: qty 5

## 2022-08-06 MED ORDER — ATENOLOL 25 MG PO TABS
50.0000 mg | ORAL_TABLET | Freq: Every day | ORAL | Status: DC
  Administered 2022-08-06 – 2022-08-09 (×3): 50 mg via ORAL
  Filled 2022-08-06 (×4): qty 2

## 2022-08-06 MED ORDER — ROCURONIUM BROMIDE 10 MG/ML (PF) SYRINGE
PREFILLED_SYRINGE | INTRAVENOUS | Status: AC
  Filled 2022-08-06: qty 10

## 2022-08-06 MED ORDER — SENNOSIDES-DOCUSATE SODIUM 8.6-50 MG PO TABS: 1.0000 | ORAL_TABLET | Freq: Every evening | ORAL | Status: DC | PRN

## 2022-08-06 MED ORDER — LORAZEPAM 1 MG PO TABS: 1.0000 mg | ORAL_TABLET | Freq: Three times a day (TID) | ORAL | Status: DC | PRN

## 2022-08-06 MED ORDER — ENOXAPARIN SODIUM 30 MG/0.3ML IJ SOSY: 30.0000 mg | PREFILLED_SYRINGE | INTRAMUSCULAR | Status: DC

## 2022-08-06 MED ORDER — METHOCARBAMOL 500 MG PO TABS
500.0000 mg | ORAL_TABLET | Freq: Four times a day (QID) | ORAL | Status: DC | PRN
  Administered 2022-08-08: 500 mg via ORAL
  Filled 2022-08-06: qty 1

## 2022-08-06 MED ORDER — BUPIVACAINE LIPOSOME 1.3 % IJ SUSP
INTRAMUSCULAR | Status: DC | PRN
  Administered 2022-08-06: 40 mL via INTRAMUSCULAR

## 2022-08-06 MED ORDER — CEFAZOLIN SODIUM-DEXTROSE 1-4 GM/50ML-% IV SOLN
INTRAVENOUS | Status: AC
  Filled 2022-08-06: qty 50

## 2022-08-06 MED ORDER — OXYCODONE HCL 5 MG PO TABS: 2.5000 mg | ORAL_TABLET | ORAL | Status: DC | PRN

## 2022-08-06 MED ORDER — BISACODYL 10 MG RE SUPP
10.0000 mg | Freq: Every day | RECTAL | Status: DC | PRN
  Administered 2022-08-07: 10 mg via RECTAL
  Filled 2022-08-06: qty 1

## 2022-08-06 MED ORDER — BUPIVACAINE HCL (PF) 0.5 % IJ SOLN
INTRAMUSCULAR | Status: AC
  Filled 2022-08-06: qty 30

## 2022-08-06 MED ORDER — OXYCODONE HCL 5 MG PO TABS: 5.0000 mg | ORAL_TABLET | ORAL | Status: DC | PRN

## 2022-08-06 MED ORDER — OXYCODONE HCL 5 MG/5ML PO SOLN: 5.0000 mg | Freq: Once | ORAL | Status: DC | PRN

## 2022-08-06 SURGICAL SUPPLY — 47 items
APL PRP STRL LF DISP 70% ISPRP (MISCELLANEOUS) ×1
BIT DRILL INTERTAN LAG SCREW (BIT) IMPLANT
BIT DRILL LONG 4.0 (BIT) IMPLANT
BLADE SURG 15 STRL LF DISP TIS (BLADE) ×1 IMPLANT
BLADE SURG 15 STRL SS (BLADE) ×1
CHLORAPREP W/TINT 26 (MISCELLANEOUS) ×1 IMPLANT
DRAPE 3/4 80X56 (DRAPES) ×1 IMPLANT
DRAPE SURG 17X11 SM STRL (DRAPES) ×2 IMPLANT
DRAPE U-SHAPE 47X51 STRL (DRAPES) ×2 IMPLANT
DRILL BIT LONG 4.0 (BIT) ×1
DRSG OPSITE POSTOP 3X4 (GAUZE/BANDAGES/DRESSINGS) ×3 IMPLANT
DRSG OPSITE POSTOP 4X6 (GAUZE/BANDAGES/DRESSINGS) IMPLANT
ELECT REM PT RETURN 9FT ADLT (ELECTROSURGICAL) ×1
ELECTRODE REM PT RTRN 9FT ADLT (ELECTROSURGICAL) ×1 IMPLANT
GLOVE BIOGEL PI IND STRL 8 (GLOVE) ×1 IMPLANT
GLOVE SURG SYN 7.5  E (GLOVE) ×1
GLOVE SURG SYN 7.5 E (GLOVE) ×1 IMPLANT
GLOVE SURG SYN 7.5 PF PI (GLOVE) ×1 IMPLANT
GOWN STRL REUS W/ TWL LRG LVL3 (GOWN DISPOSABLE) ×1 IMPLANT
GOWN STRL REUS W/ TWL XL LVL3 (GOWN DISPOSABLE) ×1 IMPLANT
GOWN STRL REUS W/TWL LRG LVL3 (GOWN DISPOSABLE) ×1
GOWN STRL REUS W/TWL XL LVL3 (GOWN DISPOSABLE) ×1
GUIDE PIN 3.2X343 (PIN) ×2
GUIDE PIN 3.2X343MM (PIN) ×2
KIT PATIENT CARE HANA TABLE (KITS) ×1 IMPLANT
KIT TURNOVER KIT A (KITS) ×1 IMPLANT
MANIFOLD NEPTUNE II (INSTRUMENTS) ×1 IMPLANT
MAT ABSORB  FLUID 56X50 GRAY (MISCELLANEOUS) ×2
MAT ABSORB FLUID 56X50 GRAY (MISCELLANEOUS) ×2 IMPLANT
NAIL TRIGEN INTERTAN 10X18CM (Nail) IMPLANT
NDL FILTER BLUNT 18X1 1/2 (NEEDLE) ×1 IMPLANT
NDL HYPO 22X1.5 SAFETY MO (MISCELLANEOUS) ×1 IMPLANT
NEEDLE FILTER BLUNT 18X1 1/2 (NEEDLE) ×1 IMPLANT
NEEDLE HYPO 22X1.5 SAFETY MO (MISCELLANEOUS) ×1 IMPLANT
NS IRRIG 1000ML POUR BTL (IV SOLUTION) ×1 IMPLANT
NS IRRIG 500ML POUR BTL (IV SOLUTION) IMPLANT
PACK HIP COMPR (MISCELLANEOUS) ×1 IMPLANT
PIN GUIDE 3.2X343MM (PIN) IMPLANT
SCREW LAG COMPR KIT 85/80 (Screw) IMPLANT
SCREW TRIGEN LOW PROF 5.0X32.5 (Screw) IMPLANT
STAPLER SKIN PROX 35W (STAPLE) ×1 IMPLANT
SUT VIC AB 2-0 CT2 27 (SUTURE) ×1 IMPLANT
SYR 10ML LL (SYRINGE) ×1 IMPLANT
SYR 30ML LL (SYRINGE) ×1 IMPLANT
TAPE CLOTH 3X10 WHT NS LF (GAUZE/BANDAGES/DRESSINGS) ×2 IMPLANT
TRAP FLUID SMOKE EVACUATOR (MISCELLANEOUS) ×1 IMPLANT
WATER STERILE IRR 500ML POUR (IV SOLUTION) ×1 IMPLANT

## 2022-08-06 NOTE — Consult Note (Signed)
ORTHOPAEDIC CONSULTATION  REQUESTING PHYSICIAN: Darlin Priestly, MD  Chief Complaint:   R hip pain, R thumb pain  History of Present Illness: History obtained from daughter, discussion with medical problems, and review of the chart  Suzanne Beltran is a 89 y.o. female with a history of severe dementia, CKD, and HTN who had a fall off of a commode yesterday. She lives at the memory care facility at [facility].  She was recently transferred there approximately 1 week ago from another facility.  She has had increased confusion since that time.  Per the patient's daughter, she can recognize family members and respond to commands, but has difficulty with memory from 1 day to the next.  She ambulates with a walker at baseline.  After her fall, she noted significant right hip pain and inability to ambulate with a walker.  Radiographs in the emergency department showed a right intertrochanteric hip fracture.  Additionally, she has noted to have bruising about the right thumb.  Radiographs show a fracture of the base of the proximal phalanx of the thumb.  Past Medical History:  Diagnosis Date   CKD (chronic kidney disease)    Dementia (HCC)    Depression    GERD (gastroesophageal reflux disease)    Hypertension    Iron deficiency anemia    Metabolic encephalopathy    Past Surgical History:  Procedure Laterality Date   ABDOMINAL HYSTERECTOMY     Social History   Socioeconomic History   Marital status: Widowed    Spouse name: Not on file   Number of children: Not on file   Years of education: Not on file   Highest education level: Not on file  Occupational History   Not on file  Tobacco Use   Smoking status: Never   Smokeless tobacco: Never  Substance and Sexual Activity   Alcohol use: Never   Drug use: Never   Sexual activity: Never  Other Topics Concern   Not on file  Social History Narrative   Not on file   Social  Determinants of Health   Financial Resource Strain: Not on file  Food Insecurity: Not on file  Transportation Needs: Patient Unable To Answer (08/05/2022)   PRAPARE - Transportation    Lack of Transportation (Medical): Patient unable to answer    Lack of Transportation (Non-Medical): Patient unable to answer  Physical Activity: Not on file  Stress: Not on file  Social Connections: Not on file   History reviewed. No pertinent family history. No Known Allergies Prior to Admission medications   Medication Sig Start Date End Date Taking? Authorizing Provider  acetaminophen (TYLENOL) 500 MG tablet Take 1,000 mg by mouth 3 (three) times daily.   Yes [provider]  amLODipine (NORVASC) 10 MG tablet Take 10 mg by mouth daily.   Yes [provider]  atenolol (TENORMIN) 50 MG tablet Take 50 mg by mouth daily.   Yes [provider]  ATIVAN 1 MG tablet Take 1 mg by mouth every 8 (eight) hours as needed. 07/03/22  Yes [provider]  bisacodyl (DULCOLAX) 10 MG suppository Place 10 mg rectally daily as needed. 05/30/22  Yes [provider]  busPIRone (BUSPAR) 5 MG tablet Take 5 mg by mouth 2 (two) times daily.   Yes [provider]  MIRALAX 17 GM/SCOOP powder SMARTSIG:17 Gram(s) By Mouth Daily PRN 06/24/22  Yes [provider]  Multiple Vitamin (MULTIVITAMIN WITH MINERALS) TABS tablet Take 1 tablet by mouth daily.  Yes [provider]  pantoprazole (PROTONIX) 40 MG tablet Take 40 mg by mouth daily. 08/01/22  Yes [provider]  phenylephrine-shark liver oil-mineral oil-petrolatum (PREPARATION H) 0.25-14-74.9 % rectal ointment Apply topically to hemorrhoids after each bowel movement up to 4 times daily.   Yes [provider]  QUEtiapine (SEROQUEL) 25 MG tablet Take 25 mg by mouth 2 (two) times daily.   Yes [provider]  senna (SENOKOT) 8.6 MG TABS tablet Take 1 tablet by mouth daily. 05/30/22  Yes  [provider]  sertraline (ZOLOFT) 100 MG tablet Take 100 mg by mouth daily.   Yes [provider]  SYSTANE COMPLETE 0.6 % SOLN Apply 1 drop to eye 2 (two) times daily. 05/02/22  Yes [provider]  cetirizine (ZYRTEC) 10 MG tablet Take 10 mg by mouth daily as needed for allergies.  Patient not taking: Reported on 08/05/2022    [provider]  Cholecalciferol (VITAMIN D3 GUMMIES PO) Take 50 mcg by mouth. Patient not taking: Reported on 08/05/2022    [provider]  ferrous sulfate 325 (65 FE) MG tablet Take 325 mg by mouth daily with breakfast. Patient not taking: Reported on 08/06/2022    [provider]  HYDROcodone-acetaminophen (NORCO) 5-325 MG tablet Take 1 tablet by mouth every 4 (four) hours as needed for moderate pain. 05/21/22   Merwyn Katos, MD  losartan-hydrochlorothiazide (HYZAAR) 50-12.5 MG tablet Take 1 tablet by mouth daily. Patient not taking: Reported on 08/06/2022 10/04/21   [provider]  mirtazapine (REMERON) 15 MG tablet Take 7.5 mg by mouth at bedtime. Patient not taking: Reported on 08/06/2022    [provider]  Multiple Vitamins-Minerals (ICAPS PO) Take 1 capsule by mouth daily. Patient not taking: Reported on 08/06/2022    [provider]  omeprazole (PRILOSEC) 20 MG capsule Take 20 mg by mouth daily as needed (heartburn).  Patient not taking: Reported on 08/05/2022    [provider]   Recent Labs    08/05/22 1425 08/05/22 1426 08/05/22 2322  WBC 14.9*  --  13.0*  HGB 9.9*  --  8.7*  HCT 30.8*  --  26.9*  PLT 468*  --  424*  K 3.6  --   --   CL 112*  --   --   CO2 18*  --   --   BUN 41*  --   --   CREATININE 1.41*  --   --   GLUCOSE 123*  --   --   CALCIUM 8.6*  --   --   INR  --  1.1  --    DG Hand Complete Right  Result Date: 08/05/2022 CLINICAL DATA:  Fall EXAM: RIGHT HAND - COMPLETE 3 VIEW COMPARISON:  None Available. FINDINGS: Comminuted fracture of the base  of the proximal phalanx of the thumb, possibly subacute or chronic. No evidence of dislocation. Advanced degenerative changes of the thumb CMC and MCP joints. Scattered moderate degenerative changes of the IP joints. Diffuse demineralization. Comminuted fracture of the base of the proximal phalanx of the thumb. Additional chronic appearing osseous deformities of the proximal fourth and fifth metacarpals. Soft tissues are unremarkable. IMPRESSION: 1. Comminuted fracture of the base of the proximal phalanx of the thumb, possibly subacute or chronic. Correlate for point tenderness. 2. Additional chronic appearing osseous deformities of the proximal fourth and fifth metacarpals. 3. Advanced degenerative changes of the thumb CMC and MCP joints. Electronically Signed   By: Tacey Ruiz  Strickland M.D.   On: 08/05/2022 16:43   DG Chest 1 View  Result Date: 08/05/2022 CLINICAL DATA:  Pain after fall EXAM: CHEST  1 VIEW COMPARISON:  Left shoulder x-ray 05/21/2022.  Chest x-ray 05/24/2021 FINDINGS: Film is rotated to the left. Stable cardiopericardial silhouette with a calcified aorta. Large hiatal hernia. No consolidation, pneumothorax, effusion or edema. Mild left basilar atelectasis. Known comminuted proximal humeral fracture on the left. Overlapping cardiac leads IMPRESSION: Hyperinflation.  Mild left basilar atelectasis. Rotated radiograph with enlarged heart.  Calcified aorta. Hiatal hernia. Electronically Signed   By: Karen Kays M.D.   On: 08/05/2022 16:40   DG Hip Unilat  With Pelvis 2-3 Views Right  Result Date: 08/05/2022 CLINICAL DATA:  Pain after fall EXAM: DG HIP (WITH OR WITHOUT PELVIS) 3V RIGHT COMPARISON:  X-ray 08/23/2020 FINDINGS: Osteopenia. Comminuted fracture of the right hip at the intertrochanteric region. Some angulation and displacement. There is deformity of the pubic bones bilaterally. The along the inferior pubic ramus on the right is stable. Left are new from the study of 2022. Please correlate  for any known history. Global degenerative changes with osteophyte formation joint space loss particularly of the left hip. Scattered colonic stool. Degenerative changes of the sacroiliac joints. IMPRESSION: Displaced comminuted intertrochanteric fracture of the right hip with severe osteopenia. More chronic appearing deformity of the pubic bones. Those on the left were not seen on the study of 2022. Please correlate with history. Scattered degenerative changes seen particularly of the left hip Electronically Signed   By: Karen Kays M.D.   On: 08/05/2022 16:38   CT Cervical Spine Wo Contrast  Result Date: 08/05/2022 CLINICAL DATA:  Witnessed fall yesterday EXAM: CT CERVICAL SPINE WITHOUT CONTRAST TECHNIQUE: Multidetector CT imaging of the cervical spine was performed without intravenous contrast. Multiplanar CT image reconstructions were also generated. RADIATION DOSE REDUCTION: This exam was performed according to the departmental dose-optimization program which includes automated exposure control, adjustment of the mA and/or kV according to patient size and/or use of iterative reconstruction technique. COMPARISON:  05/21/2022 FINDINGS: Alignment: Alignment is anatomic. Skull base and vertebrae: No acute fracture. No primary bone lesion or focal pathologic process. Soft tissues and spinal canal: No prevertebral fluid or swelling. No visible canal hematoma. Disc levels: Mild diffuse cervical spondylosis most pronounced at C3-4, C4-5, and C5-6. No change since prior exam. Upper chest: Airway is patent. Stable 3 mm left apical nodule. No specific imaging follow-up is recommended if the patient is low risk. Other: Reconstructed images demonstrate no additional findings. IMPRESSION: 1. Stable cervical spine.  No acute fracture. Electronically Signed   By: Sharlet Salina M.D.   On: 08/05/2022 16:23   CT Head Wo Contrast  Result Date: 08/05/2022 CLINICAL DATA:  Witnessed fall yesterday EXAM: CT HEAD WITHOUT  CONTRAST TECHNIQUE: Contiguous axial images were obtained from the base of the skull through the vertex without intravenous contrast. RADIATION DOSE REDUCTION: This exam was performed according to the departmental dose-optimization program which includes automated exposure control, adjustment of the mA and/or kV according to patient size and/or use of iterative reconstruction technique. COMPARISON:  05/21/2022 FINDINGS: Brain: Stable chronic small-vessel ischemic changes throughout the periventricular white matter. No evidence of acute infarct or hemorrhage. Lateral ventricles and midline structures are unremarkable. No acute extra-axial fluid collections. No mass effect. Vascular: Stable atherosclerosis.  No hyperdense vessel. Skull: Normal. Negative for fracture or focal lesion. Sinuses/Orbits: No acute finding. Other: None. IMPRESSION: 1. Stable head CT, no acute intracranial process. Electronically  Signed   By: Sharlet Salina M.D.   On: 08/05/2022 16:21     Positive ROS: All other systems have been reviewed and were otherwise negative with the exception of those mentioned in the HPI and as above.  Physical Exam: BP 116/70 (BP Location: Left Arm)   Pulse 68   Temp (!) 97.2 F (36.2 C)   Resp 16   Ht 5\' 4"  (1.626 m)   Wt 46.7 kg   SpO2 100%   BMI 17.68 kg/m  General:  Alert, no acute distress Psychiatric:  Patient is NOT competent for consent; non-agitated Cardiovascular:  No pedal edema, regular rate and rhythm Respiratory:  No wheezing, non-labored breathing GI:  Abdomen is soft and non-tender Skin:  No lesions in the area of chief complaint, no erythema Neurologic:  Sensation intact distally, CN grossly intact Lymphatic:  No axillary or cervical lymphadenopathy  Orthopedic Exam:  RLE: 5/5 ankle PF, able to dorsiflex ankle SILT grossly over foot Foot wwp Leg shortened and externally rotated. Positive logroll and axial load test  RUE: +ain/pin/u motor grossly SILT r/u/m/ax  grossly +rad pulse Moderate ecchymosis about dorsal aspect of the hand in the region of the base of the thumb extending towards the wrist.  Patient has no significant tenderness with range of motion of the IP joint, MCP, and CMC joints.  She does have some mild tenderness to firm palpation about the MCP joint  Imaging:  As above:  Right intertrochanteric hip fracture; base of the proximal phalanx of the thumb fracture, likely subacute or chronic   Assessment/Plan: 89 year old female with severe dementia with a right intertrochanteric hip fracture and subacute/chronic base of proximal phalanx thumb fracture  1. I discussed the various treatment options including both surgical and non-surgical management of the fracture with the patient and/or family (medical PoA). We discussed the high risk of perioperative complications due to patient's age, dementia, and other co-morbidities. After discussion of risks, benefits, and alternatives to surgery, the family and/or patient were in agreement to proceed with surgery.  The goals of surgery would be to provide adequate pain relief and allow for mobilization. Plan for surgery is R hip cephalomedullary nailing today, 08/06/2022.  Will plan for nonsurgical treatment of the base of the thumb given current clinical exam and likely subacute/chronic fracture 2. NPO until OR 3. Hold anticoagulation in advance of OR       Signa Kell   08/06/2022 11:06 AM

## 2022-08-06 NOTE — Anesthesia Preprocedure Evaluation (Addendum)
Anesthesia Evaluation  Patient identified by MRN, date of birth, ID bandGeneral Assessment Comment:Patient minimally responsive  Reviewed: Allergy & Precautions, NPO status , Patient's Chart, lab work & pertinent test results  History of Anesthesia Complications Negative for: history of anesthetic complications  Airway Mallampati: Unable to assess       Dental  (+) Edentulous Upper, Missing   Pulmonary neg pulmonary ROS   Pulmonary exam normal breath sounds clear to auscultation       Cardiovascular hypertension, Normal cardiovascular exam Rhythm:Regular Rate:Normal  ECG 08/05/22:  Atrial flutter Nonspecific intraventricular conduction delay Low voltage, extremity and precordial leads Minimal ST depression Baseline wander in lead(s) II aVR   Neuro/Psych  PSYCHIATRIC DISORDERS  Depression   Dementia Metabolic encephalopathy    GI/Hepatic ,GERD  ,,  Endo/Other  negative endocrine ROS    Renal/GU Renal disease (stage III CKD)     Musculoskeletal   Abdominal   Peds  Hematology  (+) Blood dyscrasia, anemia   Anesthesia Other Findings   Reproductive/Obstetrics                             Anesthesia Physical Anesthesia Plan  ASA: 3  Anesthesia Plan: General   Post-op Pain Management:    Induction: Intravenous and Rapid sequence  PONV Risk Score and Plan: 3 and Ondansetron, Dexamethasone and Treatment may vary due to age or medical condition  Airway Management Planned: Oral ETT  Additional Equipment:   Intra-op Plan:   Post-operative Plan: Extubation in OR  Informed Consent: I have reviewed the patients History and Physical, chart, labs and discussed the procedure including the risks, benefits and alternatives for the proposed anesthesia with the patient or authorized representative who has indicated his/her understanding and acceptance.   Patient has DNR.  Discussed DNR with power  of attorney and Continue DNR.   Dental advisory given and Consent reviewed with POA  Plan Discussed with: CRNA  Anesthesia Plan Comments: (Patient's daughter and POA Vickie, at bedside, consented for risks of anesthesia including but not limited to:  - adverse reactions to medications - worsening of mental status - damage to eyes, teeth, lips or other oral mucosa - nerve damage due to positioning  - sore throat or hoarseness - damage to heart, brain, nerves, lungs, other parts of body or loss of life  Informed patient's daughter about role of CRNA in peri- and intra-operative care; she voiced understanding.)        Anesthesia Quick Evaluation

## 2022-08-06 NOTE — H&P (Signed)
H&P reviewed. No significant changes noted.  

## 2022-08-06 NOTE — Anesthesia Postprocedure Evaluation (Signed)
Anesthesia Post Note  Patient: Suzanne Beltran  Procedure(s) Performed: INTRAMEDULLARY (IM) NAIL INTERTROCHANTERIC (Right: Hip)  Patient location during evaluation: PACU Anesthesia Type: General Level of consciousness: awake and alert Pain management: pain level controlled Vital Signs Assessment: post-procedure vital signs reviewed and stable Respiratory status: spontaneous breathing, nonlabored ventilation, respiratory function stable and patient connected to nasal cannula oxygen Cardiovascular status: blood pressure returned to baseline and stable Postop Assessment: no apparent nausea or vomiting Anesthetic complications: no   No notable events documented.   Last Vitals:  Vitals:   08/06/22 1415 08/06/22 1416  BP: 102/80   Pulse: (!) 58   Resp: 17   Temp:    SpO2: 92% (P) 96%    Last Pain:  Vitals:   08/06/22 1146  TempSrc: Temporal  PainSc:                  [name]

## 2022-08-06 NOTE — Plan of Care (Signed)
  Problem: Education: Goal: Knowledge of General Education information will improve Description: Including pain rating scale, medication(s)/side effects and non-pharmacologic comfort measures Outcome: Progressing   Problem: Clinical Measurements: Goal: Ability to maintain clinical measurements within normal limits will improve Outcome: Progressing   Problem: Coping: Goal: Level of anxiety will decrease Outcome: Progressing   Problem: Pain Managment: Goal: General experience of comfort will improve Outcome: Progressing   Problem: Safety: Goal: Ability to remain free from injury will improve Outcome: Progressing   Problem: Skin Integrity: Goal: Risk for impaired skin integrity will decrease Outcome: Progressing   

## 2022-08-06 NOTE — Op Note (Signed)
DATE OF SURGERY: 08/06/2022  PREOPERATIVE DIAGNOSIS:  Right intertrochanteric hip fracture Right base of thumb proximal phalanx fracture  POSTOPERATIVE DIAGNOSIS:  Right intertrochanteric hip fracture 2.   Right base of thumb proximal phalanx fracture  PROCEDURE: 1. Intramedullary nailing of Right femur with cephalomedullary device 2. Closed management of Right thumb proximal phalanx fracture  SURGEON: Rosealee Albee, MD  ANESTHESIA: Gen  EBL: 50 cc  IVF: per anesthesia record  COMPONENTS:  [employer] Trigen Intertan Short Nail: 10x169mm; 85mm lag screw with 80mm compression screw; 5 x 32.79mm distal cortical interlocking screw  INDICATIONS: Suzanne Beltran is a 89 y.o. female who sustained an intertrochanteric fracture after a fall. Risks and benefits of intramedullary nailing were explained to the patient and/or family . Risks include but are not limited to bleeding, infection, injury to tissues, nerves, vessels, nonunion/malunion, hardware failure, limb length discrepancy/hip rotation mismatch and risks of anesthesia. The patient and/or family understand these risks, have completed an informed consent, and wish to proceed.   PROCEDURE:  The patient was brought into the operating room. After administering anesthesia, the patient was placed in the supine position on the Hana table. The uninjured leg was placed in an extended position while the injured lower extremity was placed in longitudinal traction. The fracture was reduced using longitudinal traction and internal rotation. The adequacy of reduction was verified fluoroscopically in AP and lateral projections and found to be acceptable. The lateral aspect of the hip and thigh were prepped with ChloraPrep solution before being draped sterilely. Preoperative IV antibiotics were administered. A timeout was performed to verify the appropriate surgical site, patient, and procedure.    The greater trochanter was identified and an  approximately 6 cm incision was made about 2 fingerbreadths above the tip of the greater trochanter. The incision was carried down through the subcutaneous tissues to expose the gluteal fascia. This was split the length of the incision, providing access to the tip of the trochanter. Under fluoroscopic guidance, a guidewire was drilled through the tip of the trochanter into the proximal metaphysis to the level of the lesser trochanter. After verifying its position fluoroscopically in AP and lateral projections, it was overreamed with the opening reamer to the level of the lesser trochanter. The nail was selected and advanced to the appropriate depth as verified fluoroscopically.    The guide system for the lag screw was positioned and advanced through an approximately 5cm incision over the lateral aspect of the proximal femur. The guidewire was drilled up through the femoral nail and into the femoral neck to rest within 5 mm of subchondral bone. After verifying its position in the femoral neck and head in both AP and lateral projections, the guidewire was measured and appropriate sized lag screw was selected.  The channel for the compression screw was drilled and antirotation bar was placed.  Lag screw was drilled and placed in appropriate position.  Compression screw was then placed.  Appropriate compression was achieved.  The set screw was locked in place. Again, the adequacy of hardware position and fracture reduction was verified fluoroscopically in AP and lateral projections.   Attention was then turned to the distal interlocking screw in the diaphysis. Using a targeted assembly, a stab incision was made and hole was drilled through the nail. An interlocking screw was placed with excellent purchase.  Appropriate screw position was verified fluoroscopically in AP and lateral projections.   The wounds were irrigated thoroughly with sterile saline solution. Local anesthetic was  injected into the wounds. The  subcutaneous tissues were closed using 2-0 Vicryl interrupted sutures. The skin was closed using staples. Sterile occlusive dressings were applied to all wounds.   The right thumb was examined again. There was no significant crepitus on range of motion. Given the patient's preoperative exam with minimal pain to range of motion, this suggested a subacute fracture. No further treatment was deemed necessary as further immobilization would likely inhibit function.   The patient was then transferred to the recovery room in satisfactory condition.   POSTOPERATIVE PLAN: The patient will be WBAT on the operative extremity. Patient may also WBAT on RUE. Lovenox x 4 weeks for DVT ppx to start on POD#1. Perioperative IV antibiotics x 24 hours. PT/OT on POD#1.

## 2022-08-06 NOTE — Plan of Care (Signed)

## 2022-08-06 NOTE — Anesthesia Procedure Notes (Signed)
Procedure Name: Intubation Date/Time: 08/06/2022 12:56 PM  Performed by: Malva Cogan, CRNAPre-anesthesia Checklist: Patient identified, Patient being monitored, Timeout performed, Emergency Drugs available and Suction available Patient Re-evaluated:Patient Re-evaluated prior to induction Oxygen Delivery Method: Circle system utilized Preoxygenation: Pre-oxygenation with 100% oxygen Induction Type: IV induction and Rapid sequence Ventilation: Mask ventilation without difficulty Laryngoscope Size: McGraph and 3 Grade View: Grade I Tube type: Oral Tube size: 6.5 mm Number of attempts: 1 Airway Equipment and Method: Stylet Placement Confirmation: ETT inserted through vocal cords under direct vision, positive ETCO2 and breath sounds checked- equal and bilateral Secured at: 20 cm Tube secured with: Tape Dental Injury: Teeth and Oropharynx as per pre-operative assessment

## 2022-08-06 NOTE — Progress Notes (Signed)
Initial Nutrition Assessment  DOCUMENTATION CODES:   Underweight  INTERVENTION:  - Add Ensure Enlive po BID, each supplement provides 350 kcal and 20 grams of protein. (Once diet advances)  NUTRITION DIAGNOSIS:   Inadequate oral intake related to inability to eat as evidenced by NPO status.  GOAL:   Patient will meet greater than or equal to 90% of their needs  MONITOR:   Diet advancement  REASON FOR ASSESSMENT:   Malnutrition Screening Tool    ASSESSMENT:   89 y.o. female admits related to fall. PMH includes: CKD, dementia, depression, GERD, HTN, iron deficiency anemia. Pt is currently receiving medical management related to femur fracture.  Meds reviewed:  miralax, senokot, sodium bicarbonate. Labs reviewed: BUN/creatinine high.   The pt is currently NPO and out of room in OR for procedure. Pt is also oriented x1. No significant wt loss per record. Pt would likely benefit from nutrition supplements once diet is advanced. RD will continue to monitor for diet advancement and add appropriate supplements.   NUTRITION - FOCUSED PHYSICAL EXAM:  Assess at f/u. Pt in OR.   Diet Order:   Diet Order             Diet NPO time specified  Diet effective midnight                   EDUCATION NEEDS:   Not appropriate for education at this time  Skin:  Skin Assessment: Reviewed RN Assessment  Last BM:  PTA  Height:   Ht Readings from Last 1 Encounters:  08/05/22 5\' 4"  (1.626 m)    Weight:   Wt Readings from Last 1 Encounters:  08/05/22 46.7 kg    Ideal Body Weight:     BMI:  Body mass index is 17.68 kg/m.  Estimated Nutritional Needs:   Kcal:  1400-1635 kcals  Protein:  70-80 gm  Fluid:  >/= 1.4 L  Bethann Humble, RD, LDN, CNSC.

## 2022-08-06 NOTE — Progress Notes (Signed)
  PROGRESS NOTE    Suzanne Beltran  AOZ:[id number] DOB: 04/05/1925 DOA: 08/05/2022 PCP: [name]  [id number]  LOS: 1 day   Brief hospital course:   Assessment & Plan: Suzanne Beltran is a 89 y.o. female with medical history significant of advanced dementia who presented from ALF memory care unit after a fall.  Pt barely able to walk with a walker till 1 week ago and then stopped walking.  Very limited interaction with family limited to about 1 short sentence at a time.  And requiring total care giving.  Patient apparently suffered a fall from her commode yesterday onto the floor.    Daughter recalled anotehr fall in February complicated by "shoulder fracture".  * Right hip fracture --Displaced comminuted intertrochanteric fracture of the right hip with severe osteopenia.  From fall. Plan: --right IM NAIL INTERTROCHANTERIC today.  Falls Advanced dementia --Discussed with daughter goals of care for this pt, and daughter wants everything done to help pt regain function and walk again.  Given significant decline in her mobility in the setting of advanced dementia and advanced age, it's almost certain if pt regains some mobility, pt will fall again and suffer further fracture.  Daughter is aware. --followed by hospice --PT/OT after surgery  Thumb fracture, right Comminuted fracture of the base of the proximal phalanx of the thumb, possibly subacute or chronic. --no need for surgical intervention.  Leukocytosis Possibly from pain/fracture.  At this time no focus of infection.  --monitor  AMS (altered mental status) Likely worsening from pain, pain meds as well as femur fracture/inflamation. Patient has limited functinoal baseline to begin with. Monitor with pain control.  AKI vs CKD 3a --Cr varied in the past, need to trend more Cr to determine AKI vs CKD.   DVT prophylaxis: Heparin SQ Code Status: DNR  Family Communication: daughter updated at bedside  today Level of care: Med-Surg Dispo:   The patient is from: ALF Anticipated d/c is to: likely SNF rehab Anticipated d/c date is: undetermined Patient currently is not medically ready to d/c due to: OR today   Subjective and Interval History:  Pt could not answer questions.  Discussed with daughter goals of care for this pt, and daughter wants everything done to help pt regain function and walk again.  Going for right IM NAIL INTERTROCHANTERIC today.   Objective: Vitals:   08/06/22 1416 08/06/22 1434 08/06/22 1459 08/06/22 1802  BP:  123/70 132/77 105/60  Pulse:  63 61 73  Resp:  13 16 18   Temp:  97.8 F (36.6 C) 98.1 F (36.7 C) 98.2 F (36.8 C)  TempSrc:      SpO2: 96% 97% 94% 96%  Weight:      Height:        Intake/Output Summary (Last 24 hours) at 08/06/2022 1850 Last data filed at 08/06/2022 1818 Gross per 24 hour  Intake 913.71 ml  Output 330 ml  Net 583.71 ml   Filed Weights   08/05/22 1410  Weight: 46.7 kg    Examination:   Constitutional: NAD, alert, not oriented HEENT: conjunctivae and lids normal, EOMI CV: No cyanosis.   RESP: normal respiratory effort, on RA   Data Reviewed: I have personally reviewed labs and imaging studies  Time spent: 50 minutes  Darlin Priestly, MD [facility] If 7PM-7AM, please contact night-coverage 08/06/2022, 6:50 PM

## 2022-08-06 NOTE — Progress Notes (Signed)
PHARMACIST - PHYSICIAN COMMUNICATION  CONCERNING:  Enoxaparin (Lovenox) for DVT Prophylaxis    RECOMMENDATION: Patient was prescribed enoxaprin 40mg  q24 hours for VTE prophylaxis.   Filed Weights   08/05/22 1410  Weight: 46.7 kg (103 lb)    Body mass index is 17.68 kg/m.  Estimated Creatinine Clearance: 18.5 mL/min (A) (by C-G formula based on SCr of 1.34 mg/dL (H)).   Patient is candidate for enoxaparin 30mg  every 24 hours based on CrCl <31ml/min or Weight <45kg  DESCRIPTION: Pharmacy has adjusted enoxaparin dose per [facility] policy.  Patient is now receiving enoxaparin 30 mg every 24 hours    Manfred Shirts, PharmD Clinical Pharmacist  08/06/2022 3:17 PM

## 2022-08-06 NOTE — Progress Notes (Signed)
[facility]) hospitalized hospice patient visit  Ms. Lenig is a current [facility] hospice patient with a terminal diagnosis of Alzheimer's Disease. She has had several falls recently and suffered fall on 4.28 at her memory care unit. By 4.29 she was having more severe pain to right hip and hand and decision was made to have her transferred to ED for further evaluation. [facility] was notified of this transfer. Patient was admitted 4.29 with right femur fracture and per Dr. Elliot Gurney with [facility] this is a related hospital admission.   Visited with patient and daughter evening of arrival as well as this morning. Patient resting upon arrival with Mitts in place. Upon daughters' arrival patient was pleasant and Mitts were able to be removed. Patient has continued to complain of hip pain since arrival and has required IV Morphine for same. She is scheduled for surgery later this morning or early afternoon to repair femur fracture.   She is inpatient appropriate due to need for surgical repair of fractured femur and IV medications.   Vital Signs- 97.2/78/16    116/70    100% on 2L nasal cannula Intake/Output- 232/300 Abnormal labs- Chloride 112, CO2 18, Glucose 123, BUN 41, Creatinine 1.41, Ca+ 8.6, GFR 34, WBC 13, RBC 3.02, Hgb 8.7, Hct 26.9, Platelets 424 Diagnostics-   DG HIP (WITH OR WITHOUT PELVIS) 3V RIGHT IMPRESSION: Displaced comminuted intertrochanteric fracture of the right hip with severe osteopenia.  More chronic appearing deformity of the pubic bones. Those on the left were not seen on the study of 2022. Please correlate with history. Scattered degenerative changes seen particularly of the left hip  Electronically Signed   By: Karen Kays M.D.   On: 08/05/2022 16:38 All other radiology without acute findings.   IV/PRN Meds- Ofirmez 1 gram q6H, Ancef 1g IV once, D5/0.45 NS with KCL, TXA 1gram IV  Problem List-  * Femur fracture (HCC) This seems to be  patient's second fall since February this year.  Seems to be close on my exam.  Will treat pain with acetaminophen and standing as well as oxycodone and morphine as needed.  Lovenox added for DVT prophylaxis. Please see risks of complications perioperatively above from ACS NSQIP analysis. I did discuss the high likelihood (compared to the average population) of postoperative delium/confusion and likely marked (considering tenuous functional status at baseline) decline in functional status. Risk of future fall remains high. I did advise that we will continue with pain medications before and after any procedure. I do not think patient would benefit from further investigations of her cardiac status priro to currently anticipated procedure. However, I do feel that we need to controll her blood pressure better (that may be elevated due to pain)    Thumb fracture No - operative rx. See surgery note.   Leukocytosis Possibly from pain/fracture. Check blood culture and urinalysis. Lactic acid. At this time no focus of infection. Gentle hdyration. Check cbc at midnight.   AMS (altered mental status) Likely worsening from pain, pain meds as well as femur fracture/inflamation. Patient has limited functinoal baseline to begin with. Monitor with pain control . Daughter at bedside to re-orient. I do no think patient can have diet at this time.   Discharge Planning- Ongoing, dependent on results of surgery may likely d/c back to facility vs possibly rehab.  Family Contact- Talked with daughter Lynden Ang at bedside IDT- Updated Goals of care - DNR Transfer summary and med list placed on shadow chart.  Thea Gist, Charity fundraiser, [facility] Liaison [phone]

## 2022-08-06 NOTE — TOC Progression Note (Signed)
Transition of Care [facility]) - Progression Note    Patient Details  Name: GRACIELYNN BIRKEL MRN: [id number] Date of Birth: [birth year]  Transition of Care [facility]) CM/SW Contact  Marlowe Sax, RN Phone Number: 08/06/2022, 3:38 PM  Clinical Narrative:     Patient resides at a memory care and is open with [facility] PT to eval and make recommendations TOC to follow and assist with DC planning       Expected Discharge Plan and Services                                               Social Determinants of Health (SDOH) Interventions SDOH Screenings   Transportation Needs: Patient Unable To Answer (08/05/2022)  Utilities: Patient Unable To Answer (08/05/2022)  Tobacco Use: Low Risk  (08/06/2022)    Readmission Risk Interventions     No data to display

## 2022-08-06 NOTE — Transfer of Care (Signed)
Immediate Anesthesia Transfer of Care Note  Patient: Suzanne Beltran  Procedure(s) Performed: INTRAMEDULLARY (IM) NAIL INTERTROCHANTERIC (Right: Hip)  Patient Location: PACU  Anesthesia Type:General  Level of Consciousness: awake  Airway & Oxygen Therapy: Patient Spontanous Breathing and Patient connected to face mask oxygen  Post-op Assessment: Report given to RN and Post -op Vital signs reviewed and stable  Post vital signs: Reviewed and stable  Last Vitals:  Vitals Value Taken Time  BP 90/50 08/06/22 1355  Temp    Pulse 59 08/06/22 1359  Resp 14 08/06/22 1359  SpO2 100 % 08/06/22 1359  Vitals shown include unvalidated device data.  Last Pain:  Vitals:   08/06/22 1146  TempSrc: Temporal  PainSc:          Complications: No notable events documented.

## 2022-08-07 ENCOUNTER — Encounter: Payer: Self-pay | Admitting: Orthopedic Surgery

## 2022-08-07 DIAGNOSIS — D649 Anemia, unspecified: Secondary | ICD-10-CM | POA: Diagnosis not present

## 2022-08-07 DIAGNOSIS — S72001A Fracture of unspecified part of neck of right femur, initial encounter for closed fracture: Secondary | ICD-10-CM | POA: Diagnosis not present

## 2022-08-07 DIAGNOSIS — D72829 Elevated white blood cell count, unspecified: Secondary | ICD-10-CM | POA: Diagnosis not present

## 2022-08-07 LAB — CBC
HCT: 23.1 % — ABNORMAL LOW (ref 36.0–46.0)
Hemoglobin: 7.3 g/dL — ABNORMAL LOW (ref 12.0–15.0)
MCH: 28.6 pg (ref 26.0–34.0)
MCHC: 31.6 g/dL (ref 30.0–36.0)
MCV: 90.6 fL (ref 80.0–100.0)
Platelets: 394 10*3/uL (ref 150–400)
RBC: 2.55 MIL/uL — ABNORMAL LOW (ref 3.87–5.11)
RDW: 15.9 % — ABNORMAL HIGH (ref 11.5–15.5)
WBC: 15.2 10*3/uL — ABNORMAL HIGH (ref 4.0–10.5)
nRBC: 0 % (ref 0.0–0.2)

## 2022-08-07 LAB — CULTURE, BLOOD (ROUTINE X 2)
Special Requests: ADEQUATE
Special Requests: ADEQUATE

## 2022-08-07 LAB — BASIC METABOLIC PANEL
Anion gap: 6 (ref 5–15)
BUN: 43 mg/dL — ABNORMAL HIGH (ref 8–23)
CO2: 20 mmol/L — ABNORMAL LOW (ref 22–32)
Calcium: 8.2 mg/dL — ABNORMAL LOW (ref 8.9–10.3)
Chloride: 118 mmol/L — ABNORMAL HIGH (ref 98–111)
Creatinine, Ser: 1.42 mg/dL — ABNORMAL HIGH (ref 0.44–1.00)
GFR, Estimated: 34 mL/min — ABNORMAL LOW (ref >60)
Glucose, Bld: 107 mg/dL — ABNORMAL HIGH (ref 70–99)
Potassium: 4.7 mmol/L (ref 3.5–5.1)
Sodium: 144 mmol/L (ref 135–145)

## 2022-08-07 LAB — HEMOGLOBIN AND HEMATOCRIT, BLOOD
HCT: 22.9 % — ABNORMAL LOW (ref 36.0–46.0)
Hemoglobin: 7.3 g/dL — ABNORMAL LOW (ref 12.0–15.0)

## 2022-08-07 LAB — MAGNESIUM: Magnesium: 2 mg/dL (ref 1.7–2.4)

## 2022-08-07 MED ORDER — TRAMADOL HCL 50 MG PO TABS: 50.0000 mg | ORAL_TABLET | Freq: Four times a day (QID) | ORAL | 0 refills | Status: DC | PRN

## 2022-08-07 MED ORDER — TRAMADOL HCL 50 MG PO TABS
50.0000 mg | ORAL_TABLET | Freq: Four times a day (QID) | ORAL | Status: DC | PRN
  Administered 2022-08-08: 100 mg via ORAL
  Filled 2022-08-07: qty 2

## 2022-08-07 MED ORDER — ENOXAPARIN SODIUM 30 MG/0.3ML IJ SOSY
30.0000 mg | PREFILLED_SYRINGE | INTRAMUSCULAR | Status: DC
  Administered 2022-08-07 – 2022-08-08 (×2): 30 mg via SUBCUTANEOUS
  Filled 2022-08-07 (×2): qty 0.3

## 2022-08-07 MED ORDER — MORPHINE SULFATE (PF) 2 MG/ML IV SOLN: 2.0000 mg | INTRAVENOUS | Status: DC | PRN

## 2022-08-07 NOTE — Evaluation (Signed)
Clinical/Bedside Swallow Evaluation Patient Details  Name: Suzanne Beltran MRN: [id number] Date of Birth: 06/18/1924  Today's Date: 08/07/2022 Time: SLP Start Time (ACUTE ONLY): 1250 SLP Stop Time (ACUTE ONLY): 1340 SLP Time Calculation (min) (ACUTE ONLY): 50 min  Past Medical History:  Past Medical History:  Diagnosis Date   CKD (chronic kidney disease)    Dementia (HCC)    Depression    GERD (gastroesophageal reflux disease)    Hypertension    Iron deficiency anemia    Metabolic encephalopathy    Past Surgical History:  Past Surgical History:  Procedure Laterality Date   ABDOMINAL HYSTERECTOMY     INTRAMEDULLARY (IM) NAIL INTERTROCHANTERIC Right 08/06/2022   Procedure: INTRAMEDULLARY (IM) NAIL INTERTROCHANTERIC;  Surgeon: Signa Kell, MD;  Location: [facility] ORS;  Service: Orthopedics;  Laterality: Right;   HPI:  Suzanne Beltran is a 89 y/o F with PMH including Dementia, depcression, GERD, Hiatal Hernia, CKD3, HTN, shoulder fx. Here s/p fall with R femur fx and R thumb fx; ORIF 08/06/2022.  Pt has had multiple falls recently.  Daughter recalls another fall in February complicated by "shoulder fracture". Recent rather preciptous decline in function - stopped walking pretty much in the last 1 week.  Per MD note, Patient has limited functional baseline.  Pt is followed by [facility] at her NH.   CXR: Hyperinflation.  Mild left basilar atelectasis.     Rotated radiograph with enlarged heart.  Calcified aorta.     Hiatal hernia.    Assessment / Plan / Recommendation  Clinical Impression   Pt seen for BSE today. Pt awakened easily post resting in chair. Son in law present. Pt was fidgity w/ her covers upon awaking. Speech slightly muttered. MOD+ cues needed for follow through w/ tasks. Pt has Baseline Dementia.   On RA; afebrile.   OF NOTE: Son in law present and stated that pt has been exhibiting oral phase dysphagia for "a little while" and that she had transitioned to a  Pureed consistency diet at the NH d/t oral pocketing of foods and spitting out solid foods. He stated he and the family felt that it was "safer" to use a Pureed consistency diet and that he could feed her getting her to take "more of the foods" w/ this diet consistency. MD/NSG recently modified pt's regular diet consistency to Pureed today and noted improvement in pt's oral phase management when eating; NSG reported no difficulty swallowing liquids. Pills are being CRUSHED in Puree.     Pt appears to present w/ oral phase dysphagia in setting of declined Cognitive status; Baseline Dementia. Family has reported that pt's Dementia has been impacting her eating of solids at the NH. She also requires much more support and encouragement to eat/drink at meals. ANY Cognitive decline can impact overall awareness/timing of swallow and safety during po tasks which increases risk for aspiration, choking. Pt's risk for aspiration can be reduced when following general aspiration precautions and using a modified diet consistency as she has been using w/ feeding support and Supervision at meals. She requires MOD+ verbal/tactile cues for follow through during po tasks and self-feeding.        Pt consumed several trials of purees, and thin liquids via cup/straw w/ No overt clinical s/s of aspiration noted: no decline in vocal quality; no cough, and no decline in respiratory status during/post trials. Oral phase was adequate for bolus management and oral clearing of the boluses given. Time and moistening the foods provided for ease  of oral phase. Pt attempted self-feeding by holding cup to drink but required min support and guidance d/t the Cognitive decline -- doing such improves safety of swallowing.  OM Exam was cursory but appeared [facility] w/ No unilateral weakness noted. Some confusion of OM tasks. Hand over hand guidance and visual cue was helpful to initiate oral care task.         In setting of baseline Dementia and  Cognitive decline, recommend continue the dysphagia level 1(pureed foods moistened for ease of oral phase and for flavor to encourage intake) w/ thin liquids; general aspiration precautions; reduce Distractions during meals and engage pt during meals for self-feeding. Pills Crushed in Puree for safer swallowing as needed. Support w/ feeding at meals as needed. MD/NSG updated.  ST services recommends follow w/ Hospice/Palliative Care for GOC and education re: impact of Cognitive decline/Dementia on desire for oral intake/eating and on swallowing. Suspect pt is close to/at her baseline. Precautions posted in room. Family agreed. NSG updated.  SLP Visit Diagnosis: Dysphagia, oral phase (R13.11)    Aspiration Risk  Mild aspiration risk;Risk for inadequate nutrition/hydration (reduced following general aspiration precautions)    Diet Recommendation   continue the dysphagia level 1(pureed foods moistened for ease of oral phase and for flavor to encourage intake) w/ thin liquids; general aspiration precautions; reduce Distractions during meals and engage pt during meals for self-feeding. Support w/ feeding at meals as needed.   Medication Administration: Crushed with puree    Other  Recommendations Recommended Consults:  (Palliative Care/Hospice following Baseline; Dietician) Oral Care Recommendations: Oral care BID;Oral care before and after PO;Staff/trained caregiver to provide oral care    Recommendations for follow up therapy are one component of a multi-disciplinary discharge planning process, led by the attending physician.  Recommendations may be updated based on patient status, additional functional criteria and insurance authorization.  Follow up Recommendations No SLP follow up      Assistance Recommended at Discharge  full  Functional Status Assessment Patient has not had a recent decline in their functional status  Frequency and Duration  (n/a)   (n/a)       Prognosis Prognosis for  improved oropharyngeal function: Fair Barriers to Reach Goals: Cognitive deficits;Language deficits;Time post onset;Severity of deficits;Behavior Barriers/Prognosis Comment: Dementia; Hiatal Hernia      Swallow Study   General Date of Onset: 08/05/22 HPI: Suzanne Beltran is a 89 y/o F with PMH including Dementia, depcression, GERD, Hiatal Hernia, CKD3, HTN, shoulder fx. Here s/p fall with R femur fx and R thumb fx; ORIF 08/06/2022.  Pt has had multiple falls recently.  Daughter recalls another fall in February complicated by "shoulder fracture". Recent rather preciptous decline in function - stopped walking pretty much in the last 1 week.  Per MD note, Patient has limited functional baseline.  Pt is followed by [facility] at her NH.   CXR: Hyperinflation.  Mild left basilar atelectasis.     Rotated radiograph with enlarged heart.  Calcified aorta.     Hiatal hernia. Type of Study: Bedside Swallow Evaluation Previous Swallow Assessment: none Diet Prior to this Study: Dysphagia 1 (pureed);Thin liquids (Level 0) (pt is on a Pureed diet at baseline per family.) Temperature Spikes Noted: No Respiratory Status: Room air History of Recent Intubation: No Behavior/Cognition: Alert;Cooperative;Pleasant mood;Confused;Distractible;Requires cueing Oral Cavity Assessment: Within Functional Limits Oral Care Completed by SLP: Recent completion by staff Oral Cavity - Dentition:  (adequate) Vision: Impaired for self-feeding ("some vision problems" per family present) Self-Feeding  Abilities: Able to feed self;Needs assist;Needs set up Patient Positioning: Upright in chair (needed support) Baseline Vocal Quality: Normal;Low vocal intensity Volitional Cough: Cognitively unable to elicit Volitional Swallow: Unable to elicit    Oral/Motor/Sensory Function Overall Oral Motor/Sensory Function: Within functional limits   Ice Chips Ice chips: Not tested   Thin Liquid Thin Liquid: Within functional  limits Presentation: Self Fed;Straw (10+ trials) Other Comments: small sips only    Nectar Thick Nectar Thick Liquid: Not tested   Honey Thick Honey Thick Liquid: Not tested   Puree Puree: Within functional limits (grossly) Presentation: Spoon (fed; 6 trials) Other Comments: small bites accepted   Solid     Solid: Not tested Other Comments: not her baseline        Suzanne Som, MS, CCC-SLP Speech Language Pathologist Rehab Services; [facility] [phone] (ascom) Suzanne Beltran 08/07/2022,6:26 PM

## 2022-08-07 NOTE — Plan of Care (Signed)

## 2022-08-07 NOTE — Progress Notes (Signed)
[facility]) hospitalized hospice patient visit   Suzanne Beltran is a current [facility] hospice patient with a terminal diagnosis of Alzheimer's Disease. She has had several falls recently and suffered fall on 4.28 at her memory care unit. By 4.29 she was having more severe pain to right hip and hand and decision was made to have her transferred to ED for further evaluation. [facility] was notified of this transfer. Patient was admitted 4.29 with right femur fracture and per Dr. Elliot Gurney with [facility] this is a related hospital admission.    Visited with patient and daughter today at the bedside.  Patient sitting up in bed, alert and smiling.  Patient now post op right hip IM nailing from 4.30.24.  Daughter reported that the patient was up in a chair earlier this morning.  HL team spoke with the daughter about possible discharge options, depending on the recommendations from the provider and physical therapy.     She is inpatient appropriate due to the surgical repair of fractured femur and IV medications.    Vitals:   Vitals:   08/07/22 0732 08/07/22 1156  BP: 117/74 111/62  Pulse: 78 79  Resp: 18 16  Temp: 98.2 F (36.8 C) 98.2 F (36.8 C)  SpO2: 100% 100%     I&O: Intake/Output      04/30 0701 05/01 0700 05/01 0701 05/02 0700   I.V. (mL/kg) 678.5 (14.5)    IV Piggyback 2.8    Total Intake(mL/kg) 681.3 (14.6)    Urine (mL/kg/hr) 0 (0)    Blood 30    Total Output 30    Net +651.3         Urine Occurrence 3 x 1 x     Abnormal Labs:   Latest Reference Range & Units 08/07/22 06:37  BASIC METABOLIC PANEL  Rpt !          Chloride 98 - 111 mmol/L 118 (H)  CO2 22 - 32 mmol/L 20 (L)  Glucose 70 - 99 mg/dL 295 (H)  BUN 8 - 23 mg/dL 43 (H)  Creatinine 2.84 - 1.00 mg/dL 1.32 (H)  Calcium 8.9 - 10.3 mg/dL 8.2 (L)          GFR, Estimated >60 mL/min 34 (L)  WBC 4.0 - 10.5 K/uL 15.2 (H)  RBC 3.87 - 5.11 MIL/uL 2.55 (L)  Hemoglobin 12.0 - 15.0 g/dL 7.3 (L)  HCT 44.0  - 10.2 % 23.1 (L)              RDW 11.5 - 15.5 % 15.9 (H)          !: Data is abnormal (H): Data is abnormally high (L): Data is abnormally low Rpt: View report in Results Review for more information  Diagnostics:  DG HIP (WITH OR WITHOUT PELVIS) 3V RIGHT IMPRESSION: Displaced comminuted intertrochanteric fracture of the right hip with severe osteopenia.  More chronic appearing deformity of the pubic bones. Those on the left were not seen on the study of 2022. Please correlate with history. Scattered degenerative changes seen particularly of the left hip  Electronically Signed   By: Karen Kays M.D.   On: 08/05/2022 16:38 All other radiology without acute findings.    IV/PRN Meds:  Hydralazine 10mg  q 4hr prn,  NS @75ml /hr,  Cefazolin 1g/31ml q 6hrs,  LR @125  ml/hr,  Exparel 20ml once.  Problem List:  Discharge Planning:  Ongoing-Possible discharge back to [facility] vs SNF rehab.    Family Contact: Suzanne  Beltran (daughter) [phone].  Suzanne Beltran (daughter) [phone]. Spoke with Dois Davenport today at bedside.     IDT:  Updated   Goals of Care:  Ongoing. Patient is a DNR.    [facility] Liaison [phone]

## 2022-08-07 NOTE — Assessment & Plan Note (Signed)
Acute blood loss anemia Iron deficiency anemia Hbg post-op 7.3, down from 8.7. Hbg was 9.9 on admission. --Anemia panel with AM labs --Repeat H&H this afternoon --Transfuse pRBC's if Hbg < 7 or signs of active bleeding --Family gave consent for transfusion if needed

## 2022-08-07 NOTE — Evaluation (Signed)
Occupational Therapy Evaluation Patient Details Name: Suzanne Beltran MRN: [id number] DOB: [birth year] Today's Date: 08/07/2022   History of Present Illness Suzanne Beltran is a 89 y/o F with PMH including dementia, depcression, CKD3, HTN, shoulder fx. Here s/p fall with R femur fx and R thumb fx; ORIF 4/30.   Clinical Impression   Patient received for OT evaluation. See flowsheet below for details of function. Generally, patient requiring MOD A-MAX A for bed mobility, MIN A with RW for sidesteps only, and set up from seated for ADLs. Patient will benefit from continued OT while in acute care.       Recommendations for follow up therapy are one component of a multi-disciplinary discharge planning process, led by the attending physician.  Recommendations may be updated based on patient status, additional functional criteria and insurance authorization.   Assistance Recommended at Discharge Frequent or constant Supervision/Assistance  Patient can return home with the following A lot of help with walking and/or transfers;A lot of help with bathing/dressing/bathroom;Assistance with cooking/housework;Direct supervision/assist for medications management;Direct supervision/assist for financial management;Assist for transportation;Help with stairs or ramp for entrance    Functional Status Assessment  Patient has had a recent decline in their functional status and demonstrates the ability to make significant improvements in function in a reasonable and predictable amount of time.  Equipment Recommendations  Other (comment) (defer)    Recommendations for Other Services       Precautions / Restrictions Precautions Precautions: Fall Restrictions Weight Bearing Restrictions: No (is WBAT all extremities)      Mobility Bed Mobility Overal bed mobility: Needs Assistance Bed Mobility: Supine to Sit, Sit to Supine, Rolling Rolling: Min assist   Supine to sit: Mod assist Sit to supine: Max  assist   General bed mobility comments: Increased pain with sit to supine t/f. Pt able to roll in bed for change of bed pads (2/2 being wet; RN in room to assist at that time).    Transfers Overall transfer level: Needs assistance Equipment used: Rolling walker (2 wheels) Transfers: Sit to/from Stand Sit to Stand: Min assist           General transfer comment: cues      Balance Overall balance assessment: Needs assistance Sitting-balance support: Feet supported Sitting balance-Leahy Scale: Good Sitting balance - Comments: close supervision for safety   Standing balance support: Reliant on assistive device for balance, Bilateral upper extremity supported Standing balance-Leahy Scale: Poor                             ADL either performed or assessed with clinical judgement   ADL Overall ADL's : Needs assistance/impaired   Eating/Feeding Details (indicate cue type and reason): pt's food was partially eaten on bedside table; unsure if she was assisted or not. Grooming: Therapist, nutritional;Set up;Sitting Grooming Details (indicate cue type and reason): Pt using BIL hands.                               General ADL Comments: Anticipate significant assistance with LB ADLs. Requires MIN A sidestepping with RW at EOB, significant cues and assist for managing RW; pt appearing to have pain in RLE during mobility. Distracted by being cold. Able to sit up on EOB for approx 5 minutes.     Vision         Perception     Praxis  Pertinent Vitals/Pain Pain Assessment Pain Assessment: PAINAD Breathing: normal Negative Vocalization: occasional moan/groan, low speech, negative/disapproving quality Facial Expression: smiling or inexpressive Body Language: relaxed Consolability: no need to console PAINAD Score: 1 Pain Intervention(s): Repositioned     Hand Dominance  (unknown)   Extremity/Trunk Assessment Upper Extremity Assessment Upper Extremity  Assessment: Generalized weakness;RUE deficits/detail (Pt reluctant to use UE functionally or on RW, likely because she appears to be cold and wants to hold blanket around her.) RUE Deficits / Details: Has significant bruising in R hand 2/2 R thumb fx. WBAT and was using hand functionally during session today.   Lower Extremity Assessment Lower Extremity Assessment: Generalized weakness;RLE deficits/detail RLE Deficits / Details: s/p ORIF       Communication Communication Communication: Other (comment) (decreased language skills with dementia)   Cognition Arousal/Alertness: Awake/alert Behavior During Therapy: WFL for tasks assessed/performed Overall Cognitive Status: No family/caregiver present to determine baseline cognitive functioning                                 General Comments: History of dementia; lives in dementia care unit at ALF. Unsure if pt is at baseline or not. Pt able to state her name; unable to state any other information. However, is able to respond with appropriate excitement when told that it is this OT's birthday month.     General Comments  Limited by cognitive deficits, although very sweet to OT today.    Exercises     Shoulder Instructions      Home Living Family/patient expects to be discharged to:: Assisted living                             Home Equipment: Rolling Walker (2 wheels) (unsure what other DME pt has/uses; pt unable to state)   Additional Comments: Pt unable to provide hx information. From medical record, lives in ALF dementia care unit at [facility].      Prior Functioning/Environment Prior Level of Function : Needs assist;Patient poor historian/Family not available             Mobility Comments: Per medical record, pt "not very ambulatory" at baseline. Is open to [facility] hospice currently. Had shoulder fx in February. ADLs Comments: Per medical record, is "total assist" for ADLs/IADLs.         OT Problem List: Decreased range of motion;Decreased activity tolerance;Decreased strength;Impaired balance (sitting and/or standing);Decreased cognition;Decreased knowledge of use of DME or AE;Decreased knowledge of precautions;Pain      OT Treatment/Interventions: Self-care/ADL training;Therapeutic exercise;Therapeutic activities    OT Goals(Current goals can be found in the care plan section) Acute Rehab OT Goals Patient Stated Goal: Pain to stop OT Goal Formulation: Patient unable to participate in goal setting Time For Goal Achievement: 08/21/22 Potential to Achieve Goals: Good ADL Goals Pt Will Perform Grooming: with modified independence;sitting Pt Will Transfer to Toilet: with min guard assist;ambulating;bedside commode Pt Will Perform Toileting - Clothing Manipulation and hygiene: with min guard assist;sit to/from stand  OT Frequency: Min 1X/week    Co-evaluation              AM-PAC OT "6 Clicks" Daily Activity     Outcome Measure Help from another person eating meals?: A Little Help from another person taking care of personal grooming?: A Little Help from another person toileting, which includes using toliet, bedpan, or urinal?: A Lot  Help from another person bathing (including washing, rinsing, drying)?: A Lot Help from another person to put on and taking off regular upper body clothing?: A Little Help from another person to put on and taking off regular lower body clothing?: A Lot 6 Click Score: 15   End of Session Equipment Utilized During Treatment: Gait belt;Rolling walker (2 wheels) Nurse Communication: Mobility status  Activity Tolerance: Patient limited by pain Patient left: in bed;with call bell/phone within reach;with bed alarm set  OT Visit Diagnosis: Unsteadiness on feet (R26.81);Repeated falls (R29.6)                Time: 1610-9604 OT Time Calculation (min): 24 min Charges:  OT General Charges $OT Visit: 1 Visit OT Evaluation $OT Eval Moderate  Complexity: 1 Mod OT Treatments $Self Care/Home Management : 8-22 mins  Linward Foster, MS, OTR/L  Alvester Morin 08/07/2022, 1:04 PM

## 2022-08-07 NOTE — Plan of Care (Signed)

## 2022-08-07 NOTE — Progress Notes (Signed)
  Subjective: 1 Day Post-Op Procedure(s) (LRB): INTRAMEDULLARY (IM) NAIL INTERTROCHANTERIC (Right) Patient reports pain as mild.  Confused this morning. Patient is well, and has had no acute complaints or problems Plan is to go Skilled nursing facility after hospital stay. Negative for chest pain and shortness of breath Fever: no Gastrointestinal: Negative for nausea and vomiting  Objective: Vital signs in last 24 hours: Temp:  [97.2 F (36.2 C)-98.2 F (36.8 C)] 97.6 F (36.4 C) (05/01 0449) Pulse Rate:  [58-73] 70 (05/01 0449) Resp:  [13-18] 16 (05/01 0449) BP: (90-137)/(49-122) 122/58 (05/01 0449) SpO2:  [92 %-100 %] 95 % (05/01 0449)  Intake/Output from previous day:  Intake/Output Summary (Last 24 hours) at 08/07/2022 0706 Last data filed at 08/07/2022 0600 Gross per 24 hour  Intake 681.26 ml  Output 30 ml  Net 651.26 ml    Intake/Output this shift: No intake/output data recorded.  Labs: Recent Labs    08/05/22 1425 08/05/22 2322 08/06/22 1058 08/07/22 0637  HGB 9.9* 8.7* 8.7* 7.3*   Recent Labs    08/06/22 1058 08/07/22 0637  WBC 13.1* 15.2*  RBC 3.07* 2.55*  HCT 27.1* 23.1*  PLT 417* 394   Recent Labs    08/05/22 1425 08/06/22 1058  NA 141 140  K 3.6 3.7  CL 112* 113*  CO2 18* 20*  BUN 41* 41*  CREATININE 1.41* 1.34*  GLUCOSE 123* 130*  CALCIUM 8.6* 8.6*   Recent Labs    08/05/22 1426 08/06/22 1058  INR 1.1 1.3*     EXAM General - Patient is Alert and Confused Extremity - Sensation intact distally Dorsiflexion/Plantar flexion intact Compartment soft Dressing/Incision - clean, dry, no drainage Motor Function - intact, moving foot and toes well on exam.   Past Medical History:  Diagnosis Date   CKD (chronic kidney disease)    Dementia (HCC)    Depression    GERD (gastroesophageal reflux disease)    Hypertension    Iron deficiency anemia    Metabolic encephalopathy     Assessment/Plan: 1 Day Post-Op Procedure(s)  (LRB): INTRAMEDULLARY (IM) NAIL INTERTROCHANTERIC (Right) Principal Problem:   Femur fracture (HCC) Active Problems:   AMS (altered mental status)   Femoral fracture (HCC)   Leukocytosis   Thumb fracture  Estimated body mass index is 17.68 kg/m as calculated from the following:   Height as of this encounter: 5\' 4"  (1.626 m).   Weight as of this encounter: 46.7 kg. Advance diet Up with therapy  Discharge planning: Plan to follow-up at [facility] clinic orthopedics in 2 weeks for staple removal.  Dressing change if needed.  Acute blood loss anemia status post hip fracture.  Hemoglobin 7.3 from 8.7 yesterday.  Hospitalist to decide about transfusion.  DVT Prophylaxis - TED hose and heparin Weight-Bearing as tolerated to right leg Limit use of the right hand secondary to thumb fracture, but because of confusion, she is using this normally.  Dedra Skeens, PA-C Orthopaedic Surgery 08/07/2022, 7:06 AM

## 2022-08-07 NOTE — Discharge Instructions (Signed)
INSTRUCTIONS AFTER Surgery  Remove items at home which could result in a fall. This includes throw rugs or furniture in walking pathways ICE to the affected joint every three hours while awake for 30 minutes at a time, for at least the first 3-5 days, and then as needed for pain and swelling.  Continue to use ice for pain and swelling. You may notice swelling that will progress down to the foot and ankle.  This is normal after surgery.  Elevate your leg when you are not up walking on it.   Continue to use the breathing machine you got in the hospital (incentive spirometer) which will help keep your temperature down.  It is common for your temperature to cycle up and down following surgery, especially at night when you are not up moving around and exerting yourself.  The breathing machine keeps your lungs expanded and your temperature down.   DIET:  As you were doing prior to hospitalization, we recommend a well-balanced diet.  DRESSING / WOUND CARE / SHOWERING  Changes needed.  No showering.  Staples will be removed in 2 weeks at [facility] clinic orthopedics.  ACTIVITY  Increase activity slowly as tolerated, but follow the weight bearing instructions below.   No driving for 6 weeks or until further direction given by your physician.  You cannot drive while taking narcotics.  No lifting or carrying greater than 10 lbs. until further directed by your surgeon. Avoid periods of inactivity such as sitting longer than an hour when not asleep. This helps prevent blood clots.  You may return to work once you are authorized by your doctor.     WEIGHT BEARING  Weightbearing as tolerated on the right   EXERCISES Gait training and ambulation training.  Limit use of the right hand.  CONSTIPATION  Constipation is defined medically as fewer than three stools per week and severe constipation as less than one stool per week.  Even if you have a regular bowel pattern at home, your normal regimen is  likely to be disrupted due to multiple reasons following surgery.  Combination of anesthesia, postoperative narcotics, change in appetite and fluid intake all can affect your bowels.   YOU MUST use at least one of the following options; they are listed in order of increasing strength to get the job done.  They are all available over the counter, and you may need to use some, POSSIBLY even all of these options:    Drink plenty of fluids (prune juice may be helpful) and high fiber foods Colace 100 mg by mouth twice a day  Senokot for constipation as directed and as needed Dulcolax (bisacodyl), take with full glass of water  Miralax (polyethylene glycol) once or twice a day as needed.  If you have tried all these things and are unable to have a bowel movement in the first 3-4 days after surgery call either your surgeon or your primary doctor.    If you experience loose stools or diarrhea, hold the medications until you stool forms back up.  If your symptoms do not get better within 1 week or if they get worse, check with your doctor.  If you experience "the worst abdominal pain ever" or develop nausea or vomiting, please contact the office immediately for further recommendations for treatment.   ITCHING:  If you experience itching with your medications, try taking only a single pain pill, or even half a pain pill at a time.  You can also use Benadryl  over the counter for itching or also to help with sleep.   TED HOSE STOCKINGS:  Use stockings on both legs until for at least 2 weeks or as directed by physician office. They may be removed at night for sleeping.  MEDICATIONS:  See your medication summary on the "After Visit Summary" that nursing will review with you.  You may have some home medications which will be placed on hold until you complete the course of blood thinner medication.  It is important for you to complete the blood thinner medication as prescribed.  PRECAUTIONS:  If you experience  chest pain or shortness of breath - call 911 immediately for transfer to the hospital emergency department.   If you develop a fever greater that 101 F, purulent drainage from wound, increased redness or drainage from wound, foul odor from the wound/dressing, or calf pain - CONTACT YOUR SURGEON.                                                   FOLLOW-UP APPOINTMENTS:  If you do not already have a post-op appointment, please call the office for an appointment to be seen by your surgeon.  Guidelines for how soon to be seen are listed in your "After Visit Summary", but are typically between 1-4 weeks after surgery.  OTHER INSTRUCTIONS:   Right thumb splint can be utilized for protecting the right thumb.  Patient may not tolerate splint.  MAKE SURE YOU:  Understand these instructions.  Get help right away if you are not doing well or get worse.    Thank you for letting us be a part of your medical care team.  It is a privilege we respect greatly.  We hope these instructions will help you stay on track for a fast and full recovery!

## 2022-08-07 NOTE — Progress Notes (Addendum)
PROGRESS NOTE    Suzanne Beltran  ZOX:[id number] DOB: 11-27-1924 DOA: 08/05/2022 PCP: [name]  [id number]  LOS: 2 days   Brief hospital course:  Suzanne Beltran is a 89 y.o. female with medical history significant of advanced dementia who presented from ALF memory care unit after a fall.  Pt barely able to walk with a walker till 1 week ago and then stopped walking.  Very limited interaction with family limited to about 1 short sentence at a time.  And requiring total care giving.  Patient apparently suffered a fall from her commode yesterday onto the floor.    Daughter recalled anotehr fall in February complicated by "shoulder fracture".    Assessment & Plan:  * Right hip fracture --Displaced comminuted intertrochanteric fracture of the right hip with severe osteopenia.  From fall. Plan: --status post right IM NAIL INTERTROCHANTERIC 4/30. --DVT prophylaxis --Wt-bearing as tolerated on RLE --Follow up with [facility] in 2 weeks for staple removal --Dressing changes as needed --PT/OT  Falls Advanced dementia --Discussed with daughter goals of care for this pt, and daughter wants everything done to help pt regain function and walk again.  Given significant decline in her mobility in the setting of advanced dementia and advanced age, it's almost certain if pt regains some mobility, pt will fall again and suffer further fracture.  Daughter is aware. --followed by hospice --PT/OT  Thumb fracture, right Comminuted fracture of the base of the proximal phalanx of the thumb, possibly subacute or chronic. --no need for surgical intervention.  Dysphagia -- due to Alzheimer's dementia --Pureed diet for now --SLP for swallow evaluation --Aspiration precautions  Leukocytosis Possibly from pain/fracture.  At this time no focus of infection.  --monitor  AMS (altered mental status) Likely worsening from pain, pain meds as well as femur fracture/inflamation. Patient  has limited functinoal baseline to begin with. Monitor with pain control.  AKI vs CKD 3a --Cr varied in the past, need to trend more Cr to determine AKI vs CKD.  Underweight - Body mass index is 17.68 kg/m. Dementia and dysphagia contribute. SLP for swallow evaluation. Encourage PO intake when awake & alert  DVT prophylaxis: Heparin SQ >> Lovenox for fewer injections Code Status: DNR  Family Communication: family updated at bedside today  Level of care: Med-Surg  Dispo:   The patient is from: ALF Anticipated d/c is to: likely SNF rehab Anticipated d/c date is: undetermined Patient currently is not medically ready to d/c due to: requires SNF/rehab placement prior to return to ALF memory care    Subjective and Interval History:  Pt was seen with family at bedside this AM.  She was sleeping but woke up easily.  She denied any significant pain.  Nurse and family report some swallowing issues, for example chicken soup yesterday she drank the broth but would pocket the solids in the soup and not swallow.  Family agree with swallow evaluation.  They remain agreeable to SNF/rehab attempt to regain mobility and return to memory care ALF.  Pt denies any other complaints.    Objective: Vitals:   08/07/22 0449 08/07/22 0732 08/07/22 1156 08/07/22 1604  BP: (!) 122/58 117/74 111/62 97/66  Pulse: 70 78 79 75  Resp: 16 18 16 18   Temp: 97.6 F (36.4 C) 98.2 F (36.8 C) 98.2 F (36.8 C) 97.7 F (36.5 C)  TempSrc:      SpO2: 95% 100% 100% 98%  Weight:      Height:  Intake/Output Summary (Last 24 hours) at 08/07/2022 1650 Last data filed at 08/07/2022 1422 Gross per 24 hour  Intake 396.86 ml  Output 0 ml  Net 396.86 ml   Filed Weights   08/05/22 1410  Weight: 46.7 kg    Examination:   General exam: sleeping woke easily to voice, no acute distress, frail HEENT: L periorbital ecchymosis fading, moist mucus membranes Respiratory system: CTAB, no wheezes, rales or rhonchi,  normal respiratory effort. Cardiovascular system: normal S1/S2, RRR, no pedal edema, intact pedal pulses bilaterally.   Gastrointestinal system: soft, NT, ND, no HSM felt, +bowel sounds. Central nervous system: A&O x self. no gross focal neurologic deficits, normal speech Extremities: SCD"s on BLE's, no edema, normal tone Skin: dry, intact, normal temperature Psychiatry: normal mood, congruent affect, judgement and insight abnormal due to dementia    Data Reviewed: I have personally reviewed labs and imaging studies  Notable labs --- POD-1 Hbg 7.3 from 8.7.  Cl 118, bicarb 20, glucose 107, BUN 43, Cr 1.42 from 1.34, Ca 8.2, WBC 15.2, from 13.1   Time spent: 42 minutes  Pennie Banter, DO [facility] If 7PM-7AM, please contact night-coverage 08/07/2022, 4:50 PM

## 2022-08-07 NOTE — Progress Notes (Signed)
Per MD hold amlodipine and atenolol. Pt's BP is 117/74 and HR is 78.

## 2022-08-07 NOTE — Consult Note (Signed)
ANTICOAGULATION CONSULT NOTE - Initial Consult  Pharmacy Consult for Enoxaparin Indication: VTE prophylaxis  No Known Allergies  Patient Measurements: Height: 5\' 4"  (162.6 cm) Weight: 46.7 kg (103 lb) IBW/kg (Calculated) : 54.7  Vital Signs: Temp: 97.7 F (36.5 C) (05/01 1604) BP: 97/66 (05/01 1604) Pulse Rate: 75 (05/01 1604)  Labs: Recent Labs    08/05/22 1425 08/05/22 1426 08/05/22 2322 08/06/22 1058 08/07/22 0637 08/07/22 1533  HGB 9.9*  --  8.7* 8.7* 7.3* 7.3*  HCT 30.8*  --  26.9* 27.1* 23.1* 22.9*  PLT 468*  --  424* 417* 394  --   APTT  --   --   --  32  --   --   LABPROT  --  14.2  --  16.2*  --   --   INR  --  1.1  --  1.3*  --   --   CREATININE 1.41*  --   --  1.34* 1.42*  --    Estimated Creatinine Clearance: 17.5 mL/min (A) (by C-G formula based on SCr of 1.42 mg/dL (H)).   Medical History: Past Medical History:  Diagnosis Date   CKD (chronic kidney disease)    Dementia (HCC)    Depression    GERD (gastroesophageal reflux disease)    Hypertension    Iron deficiency anemia    Metabolic encephalopathy    Assessment: Suzanne Beltran is a 89 y.o. female presenting after fall. Patient is s/p right hip IM nailing on 4/30. Pharmacy has been consulted to dose enoxaparin for DVT prophylaxis.   Plan:  Start lovenox 30 mg SQ Q24H Check CBC at least every 3 days Continue to monitor renal function while on enoxaparin  Pharmacy will sign off and continue to monitor peripherally   Celene Squibb, PharmD PGY1 Pharmacy Resident 08/07/2022 4:55 PM

## 2022-08-07 NOTE — Evaluation (Signed)
Physical Therapy Evaluation Patient Details Name: JACELYNN HAYTON MRN: [id number] DOB: 01-16-1925 Today's Date: 08/07/2022  History of Present Illness  Suzanne Beltran is a 89 y/o F with PMH including dementia, depcression, CKD3, HTN, shoulder fx. Here s/p fall with R femur fx and R thumb fx; ORIF 4/30.  Clinical Impression  Pt scooted far down in her recliner on arrival, needed assist to scoot back up to safely drop leg rest and insure she stayed in the chair (per family present during eval she had been having more and more episodes of falling out of bed, buckling getting to standing, etc since her fall and L humerus fracture in Feb).  Pt pleasant and willing to try all tasks asked of her, however she needed consistent and overt cuing with all tasks.  Pt struggled with standing tasks with consistent buckling in R knee with all WBing - heavy cuing to seem to minimally help with this but per today's effort she would be unsafe to get too far from bed/chair follow due to weakness, buckling and inability to follow more than basic instructions.  She did show good effort with supine R LE exercises, needing cuing t/o but able to AROM and even tolerate some resistance relatively well.  Pt will benefit from continued PT to address functional limitations and progress per hip pinning protocol.     Recommendations for follow up therapy are one component of a multi-disciplinary discharge planning process, led by the attending physician.  Recommendations may be updated based on patient status, additional functional criteria and insurance authorization.  Follow Up Recommendations Can patient physically be transported by private vehicle: No     Assistance Recommended at Discharge Frequent or constant Supervision/Assistance  Patient can return home with the following  Two people to help with walking and/or transfers;A lot of help with bathing/dressing/bathroom;Assistance with cooking/housework;Assist for  transportation    Equipment Recommendations Other (comment) (TBD at next venue)  Recommendations for Other Services       Functional Status Assessment Patient has had a recent decline in their functional status and demonstrates the ability to make significant improvements in function in a reasonable and predictable amount of time.     Precautions / Restrictions Precautions Precautions: Fall Restrictions Weight Bearing Restrictions: No      Mobility  Bed Mobility Overal bed mobility: Needs Assistance Bed Mobility: Sit to Supine       Sit to supine: Mod assist   General bed mobility comments: Pt showed good effort in getting back to bed with much cuing but ultimately unable to get LEs back up into bed needing direct assist to do so    Transfers Overall transfer level: Needs assistance Equipment used: Rolling walker (2 wheels) Transfers: Sit to/from Stand Sit to Stand: Min assist           General transfer comment: heavy cuing for UE set up, caution for safety awarness, weight shift/positioning    Ambulation/Gait Ambulation/Gait assistance: Mod assist Gait Distance (Feet): 4 Feet Assistive device: Rolling walker (2 wheels)         General Gait Details: Pt was able to take just a few forward and turning steps recliner back to bed but consistently had R knee buckling/hesitancy that did require some intervention to arrest.  Heavy cuing for small steps, purposeful TKE and to use AD appropriately.  Pt did not lose balance, but multiple buckling episodes with R WBing.  Stairs            Wheelchair  Mobility    Modified Rankin (Stroke Patients Only)       Balance Overall balance assessment: Needs assistance Sitting-balance support: Feet supported Sitting balance-Leahy Scale: Good     Standing balance support: Reliant on assistive device for balance, Bilateral upper extremity supported Standing balance-Leahy Scale: Poor Standing balance comment: Pt with  buckling, need to direct assist with essentially all R WBing attempts                             Pertinent Vitals/Pain Pain Assessment Pain Assessment: No/denies pain Pain Intervention(s):  (Pt reports no pain at rest, does not show much grimacing, etc with R hip movement but clearly hesitant with WBing attempts)    Home Living Family/patient expects to be discharged to:: Assisted living                 Home Equipment: Agricultural consultant (2 wheels) Additional Comments: [facility], family visits QD    Prior Function Prior Level of Function : Needs assist;Patient poor historian/Family not available             Mobility Comments: Prior to shoulder fx in February pt apparently ambulating up ad lib w/o issue.  Has had more and more difficulty, falling, confusion since.  Family does try to walk her QD, often >100 ft but nothing like she had been doing prior to Feb. ADLs Comments: at ALF [facility] care     Hand Dominance   Dominant Hand:  (unknown)    Extremity/Trunk Assessment   Upper Extremity Assessment Upper Extremity Assessment: Generalized weakness;Difficult to assess due to impaired cognition RUE Deficits / Details: appears WFL, no hesiation with WBAT R hand use    Lower Extremity Assessment Lower Extremity Assessment: Generalized weakness RLE Deficits / Details: s/p ORIF (able to do some cued resisted R LE exercises - did not show excessive hesitation, etc with these)       Communication   Communication: Other (comment) (decreased language skills with dementia)  Cognition Arousal/Alertness: Awake/alert Behavior During Therapy: Restless, Impulsive Overall Cognitive Status: History of cognitive impairments - at baseline                                 General Comments: History of dementia; appears close to baseline.  Family present and confirms increased confusion since fall in Feb        General Comments General  comments (skin integrity, edema, etc.): Pt able to follow simple cues but displays little carry over    Exercises General Exercises - Lower Extremity Ankle Circles/Pumps: AROM, 10 reps Heel Slides: Strengthening, 10 reps, AAROM, AROM (with lightly resisted leg ext) Hip ABduction/ADduction: 10 reps, AROM, AAROM, Strengthening   Assessment/Plan    PT Assessment Patient needs continued PT services  PT Problem List Decreased strength;Decreased activity tolerance;Decreased range of motion;Decreased balance;Decreased safety awareness;Decreased knowledge of use of DME;Decreased mobility;Pain       PT Treatment Interventions DME instruction;Gait training;Functional mobility training;Therapeutic activities;Therapeutic exercise;Balance training;Neuromuscular re-education;Cognitive remediation;Patient/family education    PT Goals (Current goals can be found in the Care Plan section)  Acute Rehab PT Goals Patient Stated Goal: pt unable to state PT Goal Formulation: With patient/family Time For Goal Achievement: 08/20/22 Potential to Achieve Goals: Fair    Frequency 7X/week (per participation)     Co-evaluation  AM-PAC PT "6 Clicks" Mobility  Outcome Measure Help needed turning from your back to your side while in a flat bed without using bedrails?: A Little Help needed moving from lying on your back to sitting on the side of a flat bed without using bedrails?: A Lot Help needed moving to and from a bed to a chair (including a wheelchair)?: A Lot Help needed standing up from a chair using your arms (e.g., wheelchair or bedside chair)?: A Lot Help needed to walk in hospital room?: Total Help needed climbing 3-5 steps with a railing? : Total 6 Click Score: 11    End of Session Equipment Utilized During Treatment: Gait belt Activity Tolerance: Patient tolerated treatment well Patient left: with bed alarm set;with call bell/phone within reach;with family/visitor  present Nurse Communication: Mobility status PT Visit Diagnosis: Muscle weakness (generalized) (M62.81);Difficulty in walking, not elsewhere classified (R26.2);Pain Pain - Right/Left: Right Pain - part of body: Hip    Time: 1610-9604 PT Time Calculation (min) (ACUTE ONLY): 33 min   Charges:   PT Evaluation $PT Eval Low Complexity: 1 Low PT Treatments $Therapeutic Exercise: 8-22 mins        Malachi Pro, DPT 08/07/2022, 4:12 PM

## 2022-08-08 DIAGNOSIS — D649 Anemia, unspecified: Secondary | ICD-10-CM | POA: Diagnosis not present

## 2022-08-08 DIAGNOSIS — E43 Unspecified severe protein-calorie malnutrition: Secondary | ICD-10-CM | POA: Diagnosis not present

## 2022-08-08 DIAGNOSIS — D72829 Elevated white blood cell count, unspecified: Secondary | ICD-10-CM | POA: Diagnosis not present

## 2022-08-08 LAB — CBC
HCT: 23.2 % — ABNORMAL LOW (ref 36.0–46.0)
Hemoglobin: 7.6 g/dL — ABNORMAL LOW (ref 12.0–15.0)
MCH: 29.8 pg (ref 26.0–34.0)
MCHC: 32.8 g/dL (ref 30.0–36.0)
MCV: 91 fL (ref 80.0–100.0)
Platelets: 430 10*3/uL — ABNORMAL HIGH (ref 150–400)
RBC: 2.55 MIL/uL — ABNORMAL LOW (ref 3.87–5.11)
RDW: 16 % — ABNORMAL HIGH (ref 11.5–15.5)
WBC: 10.1 10*3/uL (ref 4.0–10.5)
nRBC: 0 % (ref 0.0–0.2)

## 2022-08-08 LAB — BASIC METABOLIC PANEL
Anion gap: 5 (ref 5–15)
BUN: 38 mg/dL — ABNORMAL HIGH (ref 8–23)
CO2: 20 mmol/L — ABNORMAL LOW (ref 22–32)
Calcium: 8.1 mg/dL — ABNORMAL LOW (ref 8.9–10.3)
Chloride: 115 mmol/L — ABNORMAL HIGH (ref 98–111)
Creatinine, Ser: 1.23 mg/dL — ABNORMAL HIGH (ref 0.44–1.00)
GFR, Estimated: 40 mL/min — ABNORMAL LOW (ref >60)
Glucose, Bld: 91 mg/dL (ref 70–99)
Potassium: 4 mmol/L (ref 3.5–5.1)
Sodium: 140 mmol/L (ref 135–145)

## 2022-08-08 LAB — RETICULOCYTES
Immature Retic Fract: 27.4 % — ABNORMAL HIGH (ref 2.3–15.9)
RBC.: 2.6 MIL/uL — ABNORMAL LOW (ref 3.87–5.11)
Retic Count, Absolute: 37.4 10*3/uL (ref 19.0–186.0)
Retic Ct Pct: 1.4 % (ref 0.4–3.1)

## 2022-08-08 LAB — IRON AND TIBC
Iron: 9 ug/dL — ABNORMAL LOW (ref 28–170)
Saturation Ratios: 7 % — ABNORMAL LOW (ref 10.4–31.8)
TIBC: 130 ug/dL — ABNORMAL LOW (ref 250–450)
UIBC: 121 ug/dL

## 2022-08-08 LAB — VITAMIN B12: Vitamin B-12: 921 pg/mL — ABNORMAL HIGH (ref 180–914)

## 2022-08-08 LAB — FOLATE: Folate: 29 ng/mL (ref >5.9)

## 2022-08-08 LAB — FERRITIN: Ferritin: 258 ng/mL (ref 11–307)

## 2022-08-08 MED ORDER — ADULT MULTIVITAMIN W/MINERALS CH
1.0000 | ORAL_TABLET | Freq: Every day | ORAL | Status: DC
  Administered 2022-08-08 – 2022-08-09 (×2): 1 via ORAL
  Filled 2022-08-08 (×2): qty 1

## 2022-08-08 MED ORDER — SODIUM CHLORIDE 0.9 % IV SOLN
300.0000 mg | Freq: Once | INTRAVENOUS | Status: AC
  Administered 2022-08-08: 300 mg via INTRAVENOUS
  Filled 2022-08-08: qty 15

## 2022-08-08 MED ORDER — ENSURE ENLIVE PO LIQD
237.0000 mL | Freq: Two times a day (BID) | ORAL | Status: DC
  Administered 2022-08-08 – 2022-08-09 (×3): 237 mL via ORAL

## 2022-08-08 NOTE — NC FL2 (Signed)
[place] MEDICAID FL2 LEVEL OF CARE FORM     IDENTIFICATION  Patient Name: Suzanne Beltran Birthdate: 23-Jan-1925 Sex: female Admission Date (Current Location): 08/05/2022  [facility] and [place] Number:  Chiropodist and Address:  [facility], [address] [place], [place] [address]      Provider Number: [id number]  Attending Physician Name and Address:  Pennie Banter, DO  Relative Name and Phone Number:  Billee Cashing [phone]    Current Level of Care: Hospital Recommended Level of Care: Skilled Nursing Facility Prior Approval Number:    Date Approved/Denied:   PASRR Number: pending  Discharge Plan: SNF    Current Diagnoses: Patient Active Problem List   Diagnosis Date Noted   Protein-calorie malnutrition, severe 08/08/2022   Normocytic anemia 08/07/2022   Closed right hip fracture (HCC) 08/05/2022   Femoral fracture (HCC) 08/05/2022   Leukocytosis 08/05/2022   Thumb fracture 08/05/2022   Screening examination for pulmonary tuberculosis    AMS (altered mental status)    Hypertensive encephalopathy    Hypertensive urgency 05/19/2021   Acute metabolic encephalopathy 05/19/2021   GERD (gastroesophageal reflux disease) 05/19/2021   Depression 05/19/2021   Iron deficiency anemia 05/19/2021   Dementia (HCC) 05/19/2021   Chronic kidney disease, stage 3a (HCC) 05/19/2021   Memory deficit 05/19/2021   HLD (hyperlipidemia) 05/19/2021   HTN (hypertension)     Orientation RESPIRATION BLADDER Height & Weight     Self, Place  Normal External catheter, Incontinent Weight: 46.7 kg Height:  5\' 4"  (162.6 cm)  BEHAVIORAL SYMPTOMS/MOOD NEUROLOGICAL BOWEL NUTRITION STATUS      Incontinent Diet (see dc summary)  AMBULATORY STATUS COMMUNICATION OF NEEDS Skin   Extensive Assist Verbally Normal, Surgical wounds                       Personal Care Assistance Level of Assistance  Bathing, Feeding, Dressing Bathing Assistance:  Maximum assistance Feeding assistance: Limited assistance Dressing Assistance: Maximum assistance     Functional Limitations Info  Hearing, Speech, Sight Sight Info: Adequate Hearing Info: Impaired Speech Info: Adequate    SPECIAL CARE FACTORS FREQUENCY  PT (By licensed PT), OT (By licensed OT)     PT Frequency: 5 times per week OT Frequency: 5 times per week            Contractures Contractures Info: Not present    Additional Factors Info  Code Status, Allergies Code Status Info: DNR Allergies Info: NKDA           Current Medications (08/08/2022):  This is the current hospital active medication list Current Facility-Administered Medications  Medication Dose Route Frequency Provider Last Rate Last Admin   acetaminophen (TYLENOL) tablet 1,000 mg  1,000 mg Oral Q8H Signa Kell, MD   1,000 mg at 08/08/22 0825   amLODipine (NORVASC) tablet 10 mg  10 mg Oral Daily Signa Kell, MD   10 mg at 08/08/22 1005   atenolol (TENORMIN) tablet 50 mg  50 mg Oral Daily Signa Kell, MD   50 mg at 08/08/22 1006   bisacodyl (DULCOLAX) suppository 10 mg  10 mg Rectal Daily PRN Signa Kell, MD   10 mg at 08/07/22 1632   busPIRone (BUSPAR) tablet 5 mg  5 mg Oral BID Signa Kell, MD   5 mg at 08/08/22 1006   docusate sodium (COLACE) capsule 100 mg  100 mg Oral BID Signa Kell, MD   100 mg at 08/08/22 1015  enoxaparin (LOVENOX) injection 30 mg  30 mg Subcutaneous Q24H Coulter, Eber Jones, RPH   30 mg at 08/07/22 2210   feeding supplement (ENSURE ENLIVE / ENSURE PLUS) liquid 237 mL  237 mL Oral BID BM Esaw Grandchild A, DO   237 mL at 08/08/22 1358   hydrALAZINE (APRESOLINE) injection 10 mg  10 mg Intravenous Q4H PRN Signa Kell, MD       HYDROmorphone (DILAUDID) injection 0.2-0.4 mg  0.2-0.4 mg Intravenous Q4H PRN Signa Kell, MD       labetalol (NORMODYNE) injection 20 mg  20 mg Intravenous Q2H PRN Signa Kell, MD       LORazepam (ATIVAN) tablet 1 mg  1 mg Oral Q8H PRN Signa Kell, MD        methocarbamol (ROBAXIN) tablet 500 mg  500 mg Oral Q6H PRN Signa Kell, MD       Or   methocarbamol (ROBAXIN) 500 mg in dextrose 5 % 50 mL IVPB  500 mg Intravenous Q6H PRN Signa Kell, MD       metoCLOPramide (REGLAN) tablet 5-10 mg  5-10 mg Oral Q8H PRN Signa Kell, MD       Or   metoCLOPramide (REGLAN) injection 5-10 mg  5-10 mg Intravenous Q8H PRN Signa Kell, MD       morphine (PF) 2 MG/ML injection 2 mg  2 mg Intravenous Q3H PRN Esaw Grandchild A, DO       multivitamin with minerals tablet 1 tablet  1 tablet Oral Daily Esaw Grandchild A, DO   1 tablet at 08/08/22 1219   ondansetron (ZOFRAN) tablet 4 mg  4 mg Oral Q6H PRN Signa Kell, MD       Or   ondansetron [facility]) injection 4 mg  4 mg Intravenous Q6H PRN Signa Kell, MD       pantoprazole (PROTONIX) EC tablet 40 mg  40 mg Oral Daily Signa Kell, MD   40 mg at 08/08/22 1005   polyethylene glycol (MIRALAX / GLYCOLAX) packet 17 g  17 g Oral Daily Signa Kell, MD   17 g at 08/07/22 1324   polyvinyl alcohol (LIQUIFILM TEARS) 1.4 % ophthalmic solution 1 drop  1 drop Both Eyes BID Signa Kell, MD   1 drop at 08/08/22 1015   QUEtiapine (SEROQUEL) tablet 25 mg  25 mg Oral BID Signa Kell, MD   25 mg at 08/08/22 1015   senna-docusate (Senokot-S) tablet 1 tablet  1 tablet Oral BID Signa Kell, MD   1 tablet at 08/08/22 1015   sertraline (ZOLOFT) tablet 100 mg  100 mg Oral Daily Signa Kell, MD   100 mg at 08/08/22 1006   sodium bicarbonate tablet 650 mg  650 mg Oral TID Signa Kell, MD   650 mg at 08/08/22 1005   sodium chloride flush (NS) 0.9 % injection 3 mL  3 mL Intravenous Q12H Signa Kell, MD   3 mL at 08/08/22 1014   sodium phosphate (FLEET) 7-19 GM/118ML enema 1 enema  1 enema Rectal Once PRN Signa Kell, MD       traMADol Janean Sark) tablet 50-100 mg  50-100 mg Oral Q6H PRN Pennie Banter, DO         Discharge Medications: Please see discharge summary for a list of discharge medications.  Relevant Imaging  Results:  Relevant Lab Results:   Additional Information SS #: [id number]  Haylen Shelnutt Seward Carol, RN

## 2022-08-08 NOTE — TOC Progression Note (Signed)
Transition of Care [facility]) - Progression Note    Patient Details  Name: Suzanne Beltran MRN: [id number] Date of Birth: 1925/03/30  [facility]) CM/SW Contact  Marlowe Sax, RN Phone Number: 08/08/2022, 2:36 PM  Clinical Narrative:    Met with the patient and her family in the room, she resides at a memory care facility and is open with Hospice, the family is aware that she will STR then restart Hospice once rehab is complete and she is back at memory care, PASSR is pending, Fl2 completed, Bedsearch sent   Expected Discharge Plan: Skilled Nursing Facility Barriers to Discharge: Awaiting State Approval [facility] Roberts), SNF Pending bed offer  Expected Discharge Plan and Services   Discharge Planning Services: CM Consult   Living arrangements for the past 2 months: Assisted Living Facility                 DME Arranged: N/A DME Agency: NA       HH Arranged: NA           Social Determinants of Health (SDOH) Interventions SDOH Screenings   Transportation Needs: Patient Unable To Answer (08/05/2022)  Utilities: Patient Unable To Answer (08/05/2022)  Tobacco Use: Low Risk  (08/07/2022)    Readmission Risk Interventions     No data to display

## 2022-08-08 NOTE — Care Management Important Message (Signed)
Important Message  Patient Details  Name: Suzanne Beltran MRN: [id number] Date of Birth: 22-Sep-1924   Medicare Important Message Given:  Other (see comment)  Disposition to discharge with hospice services.  Medicare IM withheld at this time out of respect for patient and family.   [name] 08/08/2022, 12:24 PM

## 2022-08-08 NOTE — Progress Notes (Signed)
PROGRESS NOTE    Suzanne Beltran  OZH:[id number] DOB: 28-Jun-1924 DOA: 08/05/2022 PCP: [name]  [id number]  LOS: 3 days   Brief hospital course:  Suzanne Beltran is a 89 y.o. female with medical history significant of advanced dementia who presented from ALF memory care unit after a fall.  Pt barely able to walk with a walker till 1 week ago and then stopped walking.  Very limited interaction with family limited to about 1 short sentence at a time.  And requiring total care giving.  Patient apparently suffered a fall from her commode yesterday onto the floor.    Daughter recalled anotehr fall in February complicated by "shoulder fracture".    Assessment & Plan:  * Right hip fracture --Displaced comminuted intertrochanteric fracture of the right hip with severe osteopenia.  From fall. Plan: --status post right IM NAIL INTERTROCHANTERIC 4/30. --DVT prophylaxis --Wt-bearing as tolerated on RLE --Follow up with [facility] in 2 weeks for staple removal --Dressing changes as needed --PT/OT  Falls Advanced dementia --Discussed with daughter goals of care for this pt, and daughter wants everything done to help pt regain function and walk again.  Given significant decline in her mobility in the setting of advanced dementia and advanced age, it's almost certain if pt regains some mobility, pt will fall again and suffer further fracture.  Daughter is aware. --followed by hospice --PT/OT  Thumb fracture, right Comminuted fracture of the base of the proximal phalanx of the thumb, possibly subacute or chronic. --no need for surgical intervention.  Normocytic Anemia / Iron Deficiency Acute blood loss anemia Hbg post-op 7.3, down from 8.7. Hbg was 9.9 on admission. Hbg today improving 7.3 >> 7.6 Anemia panel shows iron deficiency No evidence of active bleeding, suspect surgical blood loss --IV iron infusion today --Repeat CBC in AM --Transfuse pRBC's if Hbg < 7 or  signs of active bleeding --Family gave consent for transfusion if needed  Dysphagia -- due to Alzheimer's dementia --Pureed diet for now --SLP for swallow evaluation --Aspiration precautions  Leukocytosis Possibly from pain/fracture.  At this time no focus of infection.  --monitor  AMS (altered mental status) Likely worsening from pain, pain meds as well as femur fracture/inflamation. Patient has limited functinoal baseline to begin with. Monitor with pain control.  AKI vs CKD 3a --Cr varied in the past, need to trend more Cr to determine AKI vs CKD.  Underweight - Body mass index is 17.68 kg/m. Dementia and dysphagia contribute. SLP for swallow evaluation. Encourage PO intake when awake & alert  DVT prophylaxis: Heparin SQ >> Lovenox for fewer injections Code Status: DNR  Family Communication: family updated at bedside today  Level of care: Med-Surg  Dispo:   The patient is from: ALF Anticipated d/c is to: likely SNF rehab Anticipated d/c date is: undetermined Patient currently is not medically ready to d/c due to: requires SNF/rehab placement prior to return to ALF memory care    Subjective and Interval History:  Pt was sleeping comfortably this AM, family were not present at the time.  She woke easily to voice.  Denied pain or other complaints, states she's just tired.  No acute events reported.   Objective: Vitals:   08/07/22 1156 08/07/22 1604 08/07/22 2253 08/08/22 0746  BP: 111/62 97/66 122/67 138/66  Pulse: 79 75 72 70  Resp: 16 18 14 15   Temp: 98.2 F (36.8 C) 97.7 F (36.5 C) 98.1 F (36.7 C) (!) 97.5 F (36.4 C)  TempSrc:  SpO2: 100% 98% 99% 96%  Weight:      Height:        Intake/Output Summary (Last 24 hours) at 08/08/2022 1345 Last data filed at 08/08/2022 0500 Gross per 24 hour  Intake 360 ml  Output --  Net 360 ml   Filed Weights   08/05/22 1410  Weight: 46.7 kg    Examination:   General exam: sleeping woke easily to voice, no  acute distress, frail HEENT: L periorbital ecchymosis fading, moist mucus membranes Respiratory system: CTAB, no wheezes, rales or rhonchi, normal respiratory effort. Cardiovascular system: normal S1/S2, RRR, no pedal edema, intact pedal pulses bilaterally.   Gastrointestinal system: soft, NT, ND Central nervous system: A&O x self. no gross focal neurologic deficits, normal speech Extremities: SCD"s on BLE's, no edema, normal tone Skin: dry, intact, normal temperature Psychiatry: normal mood, congruent affect, judgement and insight abnormal due to dementia    Data Reviewed: I have personally reviewed labs and imaging studies  Notable labs --- POD-2 Hbg 7.3 >> 7.6.  Cl 115, bicarb 20, BUN 38, Cr improved 1.23, Ca 8.1,  WBC 15.2 >> 10.1. Platelets 430 k    Time spent: 42 minutes  Pennie Banter, DO [facility] If 7PM-7AM, please contact night-coverage 08/08/2022, 1:45 PM

## 2022-08-08 NOTE — Progress Notes (Addendum)
Nutrition Follow-up  DOCUMENTATION CODES:   Underweight, Severe malnutrition in context of chronic illness  INTERVENTION:   -MVI with minerals daily -Feeding assistance with meals -Magic cup TID with meals, each supplement provides 290 kcal and 9 grams of protein  -Feeding assistance with meals  NUTRITION DIAGNOSIS:   Severe Malnutrition related to chronic illness (dementia) as evidenced by moderate fat depletion, severe fat depletion, moderate muscle depletion, severe muscle depletion.  Ongoing  GOAL:   Patient will meet greater than or equal to 90% of their needs  Progressing   MONITOR:   PO intake, Supplement acceptance  REASON FOR ASSESSMENT:   Malnutrition Screening Tool    ASSESSMENT:   89 y.o. female admits related to fall. PMH includes: CKD, dementia, depression, GERD, HTN, iron deficiency anemia. Pt is currently receiving medical management related to femur fracture.  4/30- s/p intramedullary nailing of Right femur with cephalomedullary device, closed management of Right thumb proximal phalanx fracture 5/1- s/p BSE- continue dysphagia 1 diet with thin liquids  Reviewed I/O's: +360 ml x 24 hours and +943 ml since admission   Pt lying in bed at time of visit. Pt not very interactive with this RD, but did greet this RD when name was called. No family present to provide additional history. Per SLP, pt was on a pureed consistency diet PTA. Pt from [facility] and followed by hospice.   Pt with poor oral intake. Noted meal completions 30%.   Per TOC notes, plan for SNF placement once medically stable.   Medications reviewed and include colace, miralax, and senokot.   Labs reviewed: CBGS: 133 (inpatient orders for glycemic control are none).    NUTRITION - FOCUSED PHYSICAL EXAM:  Flowsheet Row Most Recent Value  Orbital Region Mild depletion  Upper Arm Region Severe depletion  Thoracic and Lumbar Region Severe depletion  Buccal Region Moderate  depletion  Temple Region Moderate depletion  Clavicle Bone Region Severe depletion  Clavicle and Acromion Bone Region Severe depletion  Scapular Bone Region Severe depletion  Dorsal Hand Moderate depletion  Patellar Region Severe depletion  Anterior Thigh Region Severe depletion  Posterior Calf Region Severe depletion  Edema (RD Assessment) None  Hair Reviewed  Eyes Reviewed  Mouth Reviewed  Skin Reviewed  Nails Reviewed       Diet Order:   Diet Order             DIET - DYS 1 Room service appropriate? No; Fluid consistency: Thin  Diet effective now                   EDUCATION NEEDS:   Not appropriate for education at this time  Skin:  Skin Assessment: Skin Integrity Issues: Skin Integrity Issues:: Incisions Incisions: closed rt hip  Last BM:  08/08/22 (type 6)  Height:   Ht Readings from Last 1 Encounters:  08/05/22 5\' 4"  (1.626 m)    Weight:   Wt Readings from Last 1 Encounters:  08/05/22 46.7 kg    Ideal Body Weight:  54.5 kg  BMI:  Body mass index is 17.68 kg/m.  Estimated Nutritional Needs:   Kcal:  1450-1650  Protein:  75-90 grams  Fluid:  > 1.4 L    Levada Schilling, RD, LDN, CDCES Registered Dietitian II Certified Diabetes Care and Education Specialist Please refer to [facility] for RD and/or RD on-call/weekend/after hours pager

## 2022-08-08 NOTE — Plan of Care (Signed)
  Problem: Education: Goal: Knowledge of General Education information will improve Description: Including pain rating scale, medication(s)/side effects and non-pharmacologic comfort measures 08/08/2022 2337 by Pamala Duffel, RN Outcome: Progressing 08/08/2022 2337 by Pamala Duffel, RN Outcome: Progressing   Problem: Health Behavior/Discharge Planning: Goal: Ability to manage health-related needs will improve 08/08/2022 2337 by Pamala Duffel, RN Outcome: Progressing 08/08/2022 2337 by Pamala Duffel, RN Outcome: Progressing   Problem: Clinical Measurements: Goal: Ability to maintain clinical measurements within normal limits will improve 08/08/2022 2337 by Pamala Duffel, RN Outcome: Progressing 08/08/2022 2337 by Pamala Duffel, RN Outcome: Progressing Goal: Will remain free from infection 08/08/2022 2337 by Pamala Duffel, RN Outcome: Progressing 08/08/2022 2337 by Pamala Duffel, RN Outcome: Progressing Goal: Diagnostic test results will improve 08/08/2022 2337 by Pamala Duffel, RN Outcome: Progressing 08/08/2022 2337 by Pamala Duffel, RN Outcome: Progressing Goal: Respiratory complications will improve 08/08/2022 2337 by Pamala Duffel, RN Outcome: Progressing 08/08/2022 2337 by Pamala Duffel, RN Outcome: Progressing Goal: Cardiovascular complication will be avoided 08/08/2022 2337 by Pamala Duffel, RN Outcome: Progressing 08/08/2022 2337 by Pamala Duffel, RN Outcome: Progressing   Problem: Activity: Goal: Risk for activity intolerance will decrease Outcome: Progressing   Problem: Nutrition: Goal: Adequate nutrition will be maintained Outcome: Progressing   Problem: Coping: Goal: Level of anxiety will decrease Outcome: Progressing   Problem: Elimination: Goal: Will not experience complications related to bowel motility Outcome: Progressing Goal: Will not experience complications related to  urinary retention Outcome: Progressing   Problem: Pain Managment: Goal: General experience of comfort will improve Outcome: Progressing   Problem: Safety: Goal: Ability to remain free from injury will improve Outcome: Progressing   Problem: Skin Integrity: Goal: Risk for impaired skin integrity will decrease Outcome: Progressing

## 2022-08-08 NOTE — Progress Notes (Signed)
[facility]) hospitalized hospice patient visit  Suzanne Beltran is a current [facility] hospice patient with a terminal diagnosis of Alzheimer's Disease. She has had several falls recently and suffered fall on 4.28 at her memory care unit. By 4.29 she was having more severe pain to right hip and hand and decision was made to have her transferred to ED for further evaluation. [facility] was notified of this transfer. Patient was admitted 4.29 with right femur fracture and per Dr. Elliot Gurney with [facility] this is a related hospital admission.   Visited in room with patient and family. Patient is alert and talking at times but mainly lying quietly listening to her family talk. Discussed discharge options moving forward, family is deciding between back to memory care unit vs skilled rehab. She remains inpatient appropriate as she is recovering from surgery and will receive IV Iron today.   Vital Signs- 97.5/64/15     138/66    96% room air Intake/Output- 120/0 Abnormal labs- CO2 20, BUN 38, Creatinine 1.23, Ca+ 8.1, GFR 40, RBC 2.55, Hgb 7.6, Hct 23.2, Platelets 430 Diagnostics-  None new  IV/PRN Meds- Iron Sucrose 300mg  IV x1  Problem List-   Right hip fracture --Displaced comminuted intertrochanteric fracture of the right hip with severe osteopenia.  From fall. Plan: --status post right IM NAIL INTERTROCHANTERIC 4/30. --DVT prophylaxis --Wt-bearing as tolerated on RLE --Follow up with [facility] in 2 weeks for staple removal --Dressing changes as needed --PT/OT   Falls Advanced dementia --Discussed with daughter goals of care for this pt, and daughter wants everything done to help pt regain function and walk again.  Given significant decline in her mobility in the setting of advanced dementia and advanced age, it's almost certain if pt regains some mobility, pt will fall again and suffer further fracture.  Daughter is aware. --followed by hospice --PT/OT   Thumb fracture,  right Comminuted fracture of the base of the proximal phalanx of the thumb, possibly subacute or chronic. --no need for surgical intervention.   Normocytic Anemia / Iron Deficiency Acute blood loss anemia Hbg post-op 7.3, down from 8.7. Hbg was 9.9 on admission. Hbg today improving 7.3 >> 7.6 Anemia panel shows iron deficiency No evidence of active bleeding, suspect surgical blood loss --IV iron infusion today --Repeat CBC in AM --Transfuse pRBC's if Hbg < 7 or signs of active bleeding --Family gave consent for transfusion if needed   Dysphagia -- due to Alzheimer's dementia --Pureed diet for now --SLP for swallow evaluation --Aspiration precautions   Leukocytosis Possibly from pain/fracture.  At this time no focus of infection  Discharge Planning- Ongoing, dependent on results of surgery may likely d/c back to facility vs possibly rehab.  Family Contact- Talked with daughters at bedside IDT- Updated Goals of care - DNR Thea Gist, RN, [facility] Liaison [phone]

## 2022-08-08 NOTE — Progress Notes (Signed)
The above named patient is recommended to go to Short Term Rehab for strengthening and gait training for balance.  It is expected that the Short Term Rehab stay will be less than 30 days.  The patient is expected to return home after Rehab.  

## 2022-08-08 NOTE — Progress Notes (Signed)
  Subjective: 2 Days Post-Op Procedure(s) (LRB): INTRAMEDULLARY (IM) NAIL INTERTROCHANTERIC (Right) Patient reports pain as mild.  Less confused this morning.  No complaints Patient is well, and has had no acute complaints or problems Plan is to go Skilled nursing facility after hospital stay. Negative for chest pain and shortness of breath Fever: no Gastrointestinal: Negative for nausea and vomiting  Objective: Vital signs in last 24 hours: Temp:  [97.7 F (36.5 C)-98.2 F (36.8 C)] 98.1 F (36.7 C) (05/01 2253) Pulse Rate:  [72-79] 72 (05/01 2253) Resp:  [14-18] 14 (05/01 2253) BP: (97-122)/(62-74) 122/67 (05/01 2253) SpO2:  [98 %-100 %] 99 % (05/01 2253)  Intake/Output from previous day:  Intake/Output Summary (Last 24 hours) at 08/08/2022 0707 Last data filed at 08/08/2022 0500 Gross per 24 hour  Intake 360 ml  Output --  Net 360 ml    Intake/Output this shift: No intake/output data recorded.  Labs: Recent Labs    08/05/22 2322 08/06/22 1058 08/07/22 0637 08/07/22 1533 08/08/22 0459  HGB 8.7* 8.7* 7.3* 7.3* 7.6*   Recent Labs    08/07/22 0637 08/07/22 1533 08/08/22 0459  WBC 15.2*  --  10.1  RBC 2.55*  --  2.55*  2.60*  HCT 23.1* 22.9* 23.2*  PLT 394  --  430*   Recent Labs    08/06/22 1058 08/07/22 0637  NA 140 144  K 3.7 4.7  CL 113* 118*  CO2 20* 20*  BUN 41* 43*  CREATININE 1.34* 1.42*  GLUCOSE 130* 107*  CALCIUM 8.6* 8.2*   Recent Labs    08/05/22 1426 08/06/22 1058  INR 1.1 1.3*     EXAM General - Patient is Alert and Confused Extremity - Sensation intact distally Dorsiflexion/Plantar flexion intact Compartment soft Dressing/Incision - clean, dry, no drainage Motor Function - intact, moving foot and toes well on exam.   Past Medical History:  Diagnosis Date   CKD (chronic kidney disease)    Dementia (HCC)    Depression    GERD (gastroesophageal reflux disease)    Hypertension    Iron deficiency anemia    Metabolic  encephalopathy     Assessment/Plan: 2 Days Post-Op Procedure(s) (LRB): INTRAMEDULLARY (IM) NAIL INTERTROCHANTERIC (Right) Principal Problem:   Closed right hip fracture (HCC) Active Problems:   AMS (altered mental status)   Femoral fracture (HCC)   Leukocytosis   Thumb fracture   Normocytic anemia  Estimated body mass index is 17.68 kg/m as calculated from the following:   Height as of this encounter: 5\' 4"  (1.626 m).   Weight as of this encounter: 46.7 kg. Advance diet Up with therapy  Discharge planning: Plan to follow-up at [facility] clinic orthopedics in 2 weeks for staple removal.  Dressing change if needed.  Acute blood loss anemia status post hip fracture.  Hemoglobin 7.6 from 7.3 yesterday.   Hospitalist to decide about transfusion.  DVT Prophylaxis - TED hose and heparin Weight-Bearing as tolerated to right leg Limit use of the right hand secondary to thumb fracture, but because of confusion, she is using this normally.  Dedra Skeens, PA-C Orthopaedic Surgery 08/08/2022, 7:07 AM

## 2022-08-08 NOTE — Plan of Care (Signed)
  Problem: Education: Goal: Knowledge of General Education information will improve Description Including pain rating scale, medication(s)/side effects and non-pharmacologic comfort measures Outcome: Progressing   Problem: Clinical Measurements: Goal: Ability to maintain clinical measurements within normal limits will improve Outcome: Progressing   Problem: Activity: Goal: Risk for activity intolerance will decrease Outcome: Progressing   Problem: Nutrition: Goal: Adequate nutrition will be maintained Outcome: Progressing   Problem: Elimination: Goal: Will not experience complications related to bowel motility Outcome: Progressing   Problem: Pain Managment: Goal: General experience of comfort will improve Outcome: Progressing   

## 2022-08-08 NOTE — Progress Notes (Signed)
Physical Therapy Treatment Patient Details Name: Suzanne Beltran MRN: [id number] DOB: [birth year] Today's Date: 08/08/2022   History of Present Illness 89 y/o F with PMH including dementia, depcression, CKD3, HTN, shoulder fx. Here s/p fall with R femur fx and R thumb fx; R hip ORIF 4/30.    PT Comments    Pt continues to be confused and needing excessive cuing but once helped to initiate a task showed good effort.  She was able to maintain R knee TKE during WBing at times during gait but still having consistent buckling and need for direct assist with heavy UE use on walker.  Pt was able to ambulate 6 ft, then ~12 ft but again needing close guarding and almost constant cuing.  Pt showed increased initiation with supine exercises this date, family present and pleased to see her up and walking.  Returned to bed post session, continue with POC.   Recommendations for follow up therapy are one component of a multi-disciplinary discharge planning process, led by the attending physician.  Recommendations may be updated based on patient status, additional functional criteria and insurance authorization.  Follow Up Recommendations  Can patient physically be transported by private vehicle: No    Assistance Recommended at Discharge Frequent or constant Supervision/Assistance  Patient can return home with the following Two people to help with walking and/or transfers;A lot of help with bathing/dressing/bathroom;Assistance with cooking/housework;Assist for transportation   Equipment Recommendations   (TBD at rehab)    Recommendations for Other Services       Precautions / Restrictions Precautions Precautions: Fall Restrictions Weight Bearing Restrictions: Yes RLE Weight Bearing: Weight bearing as tolerated Other Position/Activity Restrictions: no R UE WBing restrictions     Mobility  Bed Mobility Overal bed mobility: Needs Assistance Bed Mobility: Sit to Supine       Sit to supine: Mod  assist   General bed mobility comments: Pt struggled to coordinate actually geting back into bed despite much cuing, ultimately unable to initiate getting LEs back up into bed needing direct assist to do so    Transfers Overall transfer level: Needs assistance Equipment used: Rolling walker (2 wheels) Transfers: Sit to/from Stand Sit to Stand: Min assist           General transfer comment: 3x sit to stand t/o session, each time needing excessive cuing/encouragement and needing direct hands on for hand placement and initiation of movement.    Ambulation/Gait Ambulation/Gait assistance: Mod assist Gait Distance (Feet): 12 Feet Assistive device: Rolling walker (2 wheels)         General Gait Details: 6 ft, then after rest break ~12 ft.  Pt continues to have relatively consistent buckling in the R LE, though she is at times able to maintain standing w/o buckling with heavy in-the-moment cuing.  Pt highly reliant on UEs.  Did need consistent min/mod assist to arrest intermittent buckling episodes.   Stairs             Wheelchair Mobility    Modified Rankin (Stroke Patients Only)       Balance Overall balance assessment: Needs assistance Sitting-balance support: Feet supported Sitting balance-Leahy Scale: Fair Sitting balance - Comments: close supervision for safety     Standing balance-Leahy Scale: Poor Standing balance comment: Pt with intermittent R LE buckling, need to direct assist to maintain standing t/o gait/standing effort  Cognition Arousal/Alertness: Awake/alert Behavior During Therapy: Impulsive Overall Cognitive Status: History of cognitive impairments - at baseline                                          Exercises General Exercises - Lower Extremity Short Arc Quad: AROM, Strengthening, 10 reps Heel Slides: Strengthening, 10 reps, AAROM, AROM Hip ABduction/ADduction: 10 reps, Strengthening,  AROM Straight Leg Raises: AAROM, 5 reps    General Comments General comments (skin integrity, edema, etc.): Consistent and extensive VCs/hand over hand 2/2 cognition      Pertinent Vitals/Pain Pain Assessment Pain Assessment: Faces Faces Pain Scale: Hurts little more Pain Location: unable to rate, did have guarding but reports "not too bad" when asked specifically about hip pain    Home Living                          Prior Function            PT Goals (current goals can now be found in the care plan section) Progress towards PT goals: Progressing toward goals    Frequency    7X/week      PT Plan Current plan remains appropriate    Co-evaluation              AM-PAC PT "6 Clicks" Mobility   Outcome Measure  Help needed turning from your back to your side while in a flat bed without using bedrails?: A Little Help needed moving from lying on your back to sitting on the side of a flat bed without using bedrails?: A Lot Help needed moving to and from a bed to a chair (including a wheelchair)?: A Lot Help needed standing up from a chair using your arms (e.g., wheelchair or bedside chair)?: A Little Help needed to walk in hospital room?: A Lot Help needed climbing 3-5 steps with a railing? : Total 6 Click Score: 13    End of Session Equipment Utilized During Treatment: Gait belt Activity Tolerance: Patient limited by fatigue Patient left: with bed alarm set;with call bell/phone within reach;with family/visitor present Nurse Communication: Mobility status PT Visit Diagnosis: Muscle weakness (generalized) (M62.81);Difficulty in walking, not elsewhere classified (R26.2);Pain Pain - Right/Left: Right Pain - part of body: Hip     Time: 4098-1191 PT Time Calculation (min) (ACUTE ONLY): 31 min  Charges:  $Gait Training: 8-22 mins $Therapeutic Exercise: 8-22 mins                     Malachi Pro, DPT 08/08/2022, 2:15 PM

## 2022-08-09 ENCOUNTER — Encounter: Payer: Self-pay | Admitting: Internal Medicine

## 2022-08-09 DIAGNOSIS — S72001A Fracture of unspecified part of neck of right femur, initial encounter for closed fracture: Secondary | ICD-10-CM | POA: Diagnosis not present

## 2022-08-09 DIAGNOSIS — D649 Anemia, unspecified: Secondary | ICD-10-CM

## 2022-08-09 LAB — BASIC METABOLIC PANEL
Anion gap: 4 — ABNORMAL LOW (ref 5–15)
BUN: 33 mg/dL — ABNORMAL HIGH (ref 8–23)
CO2: 21 mmol/L — ABNORMAL LOW (ref 22–32)
Calcium: 8 mg/dL — ABNORMAL LOW (ref 8.9–10.3)
Chloride: 115 mmol/L — ABNORMAL HIGH (ref 98–111)
Creatinine, Ser: 1.06 mg/dL — ABNORMAL HIGH (ref 0.44–1.00)
GFR, Estimated: 48 mL/min — ABNORMAL LOW (ref >60)
Glucose, Bld: 97 mg/dL (ref 70–99)
Potassium: 3.7 mmol/L (ref 3.5–5.1)
Sodium: 140 mmol/L (ref 135–145)

## 2022-08-09 LAB — CBC
HCT: 22.4 % — ABNORMAL LOW (ref 36.0–46.0)
Hemoglobin: 7.1 g/dL — ABNORMAL LOW (ref 12.0–15.0)
MCH: 28.5 pg (ref 26.0–34.0)
MCHC: 31.7 g/dL (ref 30.0–36.0)
MCV: 90 fL (ref 80.0–100.0)
Platelets: 442 10*3/uL — ABNORMAL HIGH (ref 150–400)
RBC: 2.49 MIL/uL — ABNORMAL LOW (ref 3.87–5.11)
RDW: 15.9 % — ABNORMAL HIGH (ref 11.5–15.5)
WBC: 8.1 10*3/uL (ref 4.0–10.5)
nRBC: 0.2 % (ref 0.0–0.2)

## 2022-08-09 LAB — HEMOGLOBIN AND HEMATOCRIT, BLOOD
HCT: 23.9 % — ABNORMAL LOW (ref 36.0–46.0)
Hemoglobin: 7.7 g/dL — ABNORMAL LOW (ref 12.0–15.0)

## 2022-08-09 MED ORDER — TRAMADOL HCL 50 MG PO TABS: 50.0000 mg | ORAL_TABLET | Freq: Four times a day (QID) | ORAL | 0 refills | Status: DC | PRN

## 2022-08-09 MED ORDER — ENSURE ENLIVE PO LIQD: 237.0000 mL | Freq: Two times a day (BID) | ORAL | 12 refills | Status: DC

## 2022-08-09 MED ORDER — LOPERAMIDE HCL 2 MG PO CAPS: 2.0000 mg | ORAL_CAPSULE | ORAL | 0 refills | Status: DC | PRN

## 2022-08-09 MED ORDER — ENOXAPARIN SODIUM 30 MG/0.3ML IJ SOSY: 30.0000 mg | PREFILLED_SYRINGE | INTRAMUSCULAR | Status: AC

## 2022-08-09 MED ORDER — METHOCARBAMOL 500 MG PO TABS: 500.0000 mg | ORAL_TABLET | Freq: Four times a day (QID) | ORAL | Status: DC | PRN

## 2022-08-09 MED ORDER — LOPERAMIDE HCL 2 MG PO CAPS
2.0000 mg | ORAL_CAPSULE | ORAL | Status: DC | PRN
  Administered 2022-08-09: 2 mg via ORAL
  Filled 2022-08-09: qty 1

## 2022-08-09 NOTE — Plan of Care (Signed)
  Problem: Activity: Goal: Risk for activity intolerance will decrease Outcome: Progressing   Problem: Nutrition: Goal: Adequate nutrition will be maintained Outcome: Progressing   Problem: Coping: Goal: Level of anxiety will decrease Outcome: Progressing   Problem: Elimination: Goal: Will not experience complications related to bowel motility Outcome: Progressing   Problem: Pain Managment: Goal: General experience of comfort will improve Outcome: Progressing   Problem: Skin Integrity: Goal: Risk for impaired skin integrity will decrease Outcome: Progressing   

## 2022-08-09 NOTE — Progress Notes (Addendum)
Contacted [facility] and reported to Nurse [employer].

## 2022-08-09 NOTE — Progress Notes (Signed)
Occupational Therapy Treatment Patient Details Name: Suzanne Beltran MRN: [id number] DOB: 10-27-1924 Today's Date: 08/09/2022   History of present illness 89 y/o F with PMH including dementia, depcression, CKD3, HTN, shoulder fx. Here s/p fall with R femur fx and R thumb fx; R hip ORIF 4/30.   OT comments  Pt received seated in chair; slid down with BIL LE elevated; family member in room. Appearing sleepy and grumpy; willing to work with OT on grooming reluctantly. See flowsheet below for further details of session. Left in recliner with all needs in reach.  Patient will benefit from continued OT while in acute care.    Recommendations for follow up therapy are one component of a multi-disciplinary discharge planning process, led by the attending physician.  Recommendations may be updated based on patient status, additional functional criteria and insurance authorization.    Assistance Recommended at Discharge Frequent or constant Supervision/Assistance  Patient can return home with the following  A lot of help with walking and/or transfers;A lot of help with bathing/dressing/bathroom;Assistance with cooking/housework;Direct supervision/assist for medications management;Direct supervision/assist for financial management;Assist for transportation;Help with stairs or ramp for entrance   Equipment Recommendations  Other (comment) (defer to next venue of care)    Recommendations for Other Services      Precautions / Restrictions Precautions Precautions: Fall Restrictions Weight Bearing Restrictions: Yes RLE Weight Bearing: Weight bearing as tolerated       Mobility Bed Mobility               General bed mobility comments: pt received in chair and not agreeable to transferring at this time. MAX A x2 to scoot back in the chair.    Transfers                   General transfer comment: pt declined t/f today     Balance   Sitting-balance support: Feet  supported Sitting balance-Leahy Scale: Fair Sitting balance - Comments: close supervision for safety                                   ADL either performed or assessed with clinical judgement   ADL Overall ADL's : Needs assistance/impaired     Grooming: Wash/dry face;Oral care;Brushing hair;Set up;Sitting (in recliner) Grooming Details (indicate cue type and reason): needing encouragement, as pt not motivated for participation today, but able to participate with set up.                               General ADL Comments: Pt declined t/f today; received seated in chair with LE elevated, pt's buttocks slid down in chair; required MAX A x2 (pt's family member assisting) to scoot pt's buttocks back in chair with chuck pad.    Extremity/Trunk Assessment Upper Extremity Assessment Upper Extremity Assessment: Generalized weakness (Pt using RUE well during session despite R thumb fx)   Lower Extremity Assessment Lower Extremity Assessment: Generalized weakness;RLE deficits/detail RLE Deficits / Details: s/p ORIF        Vision       Perception     Praxis      Cognition Arousal/Alertness: Awake/alert Behavior During Therapy: Flat affect Overall Cognitive Status: History of cognitive impairments - at baseline  General Comments: Pt is alert, female family member in room, pt appearing in foul mood at any suggestions made (especially by family member), but pt did participate in grooming.        Exercises      Shoulder Instructions       General Comments Limited by cognition.    Pertinent Vitals/ Pain       Pain Assessment Pain Assessment: PAINAD Breathing: normal Negative Vocalization: none Facial Expression: smiling or inexpressive Body Language: relaxed Consolability: no need to console PAINAD Score: 0 Pain Location: seated in chair and denies pain at this time Pain Intervention(s): Monitored  during session  Home Living                                          Prior Functioning/Environment              Frequency  Min 1X/week        Progress Toward Goals  OT Goals(current goals can now be found in the care plan section)  Progress towards OT goals: Not progressing toward goals - comment (limited by cognitive deficits/motivation; continue goals)  Acute Rehab OT Goals Patient Stated Goal: to continue resting OT Goal Formulation: Patient unable to participate in goal setting Time For Goal Achievement: 08/21/22 Potential to Achieve Goals: Good ADL Goals Pt Will Perform Grooming: with modified independence;sitting Pt Will Transfer to Toilet: with min guard assist;ambulating;bedside commode Pt Will Perform Toileting - Clothing Manipulation and hygiene: with min guard assist;sit to/from stand  Plan Discharge plan remains appropriate    Co-evaluation                 AM-PAC OT "6 Clicks" Daily Activity     Outcome Measure   Help from another person eating meals?: A Little Help from another person taking care of personal grooming?: A Little Help from another person toileting, which includes using toliet, bedpan, or urinal?: A Lot Help from another person bathing (including washing, rinsing, drying)?: A Lot Help from another person to put on and taking off regular upper body clothing?: A Little Help from another person to put on and taking off regular lower body clothing?: A Lot 6 Click Score: 15    End of Session    OT Visit Diagnosis: Unsteadiness on feet (R26.81);Repeated falls (R29.6)   Activity Tolerance Treatment limited secondary to agitation   Patient Left in chair;with call bell/phone within reach;with chair alarm set;with family/visitor present   Nurse Communication Mobility status        Time: 0981-1914 OT Time Calculation (min): 15 min  Charges: OT General Charges $OT Visit: 1 Visit OT Treatments $Self Care/Home  Management : 8-22 mins  Linward Foster, MS, OTR/L   Alvester Morin 08/09/2022, 1:04 PM

## 2022-08-09 NOTE — TOC Progression Note (Signed)
Transition of Care [facility]) - Progression Note    Patient Details  Name: Suzanne Beltran MRN: [id number] Date of Birth: 04-Aug-1924  [facility] CM/SW Contact  Marlowe Sax, RN Phone Number: 08/09/2022, 2:12 PM  Clinical Narrative:     Met with the patient and her daughter Lynden Ang in the room and provided with the [facility] at [facility], I explained I would call EMS to transport and she would DC today, daughter stated understanding  Expected Discharge Plan: Skilled Nursing Facility Barriers to Discharge: Awaiting State Approval [facility]), SNF Pending bed offer  Expected Discharge Plan and Services   Discharge Planning Services: CM Consult   Living arrangements for the past 2 months: Assisted Living Facility Expected Discharge Date: 08/09/22               DME Arranged: N/A DME Agency: NA       HH Arranged: NA           Social Determinants of Health (SDOH) Interventions SDOH Screenings   Transportation Needs: Patient Unable To Answer (08/05/2022)  Utilities: Patient Unable To Answer (08/05/2022)  Tobacco Use: Low Risk  (08/09/2022)    Readmission Risk Interventions     No data to display

## 2022-08-09 NOTE — Progress Notes (Signed)
EMS transported pt from [facility] to [facility]. 

## 2022-08-09 NOTE — Plan of Care (Signed)
  Problem: Education: Goal: Knowledge of General Education information will improve Description: Including pain rating scale, medication(s)/side effects and non-pharmacologic comfort measures 08/09/2022 1536 by Tamiyah Moulin Bet, LPN Outcome: Adequate for Discharge 08/09/2022 1051 by Chrysten Woulfe Bet, LPN Outcome: Progressing   Problem: Health Behavior/Discharge Planning: Goal: Ability to manage health-related needs will improve 08/09/2022 1536 by Darneshia Demary Bet, LPN Outcome: Adequate for Discharge 08/09/2022 1051 by Kavitha Lansdale Bet, LPN Outcome: Progressing   Problem: Clinical Measurements: Goal: Ability to maintain clinical measurements within normal limits will improve 08/09/2022 1536 by Ariba Lehnen Bet, LPN Outcome: Adequate for Discharge 08/09/2022 1051 by Abdulla Pooley Bet, LPN Outcome: Progressing Goal: Will remain free from infection 08/09/2022 1536 by Gurjot Brisco Bet, LPN Outcome: Adequate for Discharge 08/09/2022 1051 by Kane Kusek Bet, LPN Outcome: Progressing Goal: Diagnostic test results will improve 08/09/2022 1536 by Calley Drenning Bet, LPN Outcome: Adequate for Discharge 08/09/2022 1051 by Cairo Lingenfelter Bet, LPN Outcome: Progressing Goal: Respiratory complications will improve 08/09/2022 1536 by Haylea Schlichting Bet, LPN Outcome: Adequate for Discharge 08/09/2022 1051 by Malayiah Mcbrayer Bet, LPN Outcome: Progressing Goal: Cardiovascular complication will be avoided 08/09/2022 1536 by Alpheus Stiff Bet, LPN Outcome: Adequate for Discharge 08/09/2022 1051 by Boen Sterbenz Bet, LPN Outcome: Progressing   Problem: Activity: Goal: Risk for activity intolerance will decrease 08/09/2022 1536 by Gor Vestal Bet, LPN Outcome: Adequate for Discharge 08/09/2022 1051 by Preet Mangano Bet, LPN Outcome: Progressing   Problem: Nutrition: Goal: Adequate nutrition will be maintained 08/09/2022 1536 by Labib Cwynar Bet, LPN Outcome: Adequate for Discharge 08/09/2022 1051 by Haddie Bruhl Bet, LPN Outcome: Progressing   Problem:  Coping: Goal: Level of anxiety will decrease 08/09/2022 1536 by Camry Robello Bet, LPN Outcome: Adequate for Discharge 08/09/2022 1051 by Jaycie Kregel Bet, LPN Outcome: Progressing   Problem: Elimination: Goal: Will not experience complications related to bowel motility 08/09/2022 1536 by Careem Yasui Bet, LPN Outcome: Adequate for Discharge 08/09/2022 1051 by Stephone Gum Bet, LPN Outcome: Progressing Goal: Will not experience complications related to urinary retention 08/09/2022 1536 by Josuel Koeppen Bet, LPN Outcome: Adequate for Discharge 08/09/2022 1051 by Chablis Losh Bet, LPN Outcome: Progressing   Problem: Pain Managment: Goal: General experience of comfort will improve 08/09/2022 1536 by Tatem Holsonback Bet, LPN Outcome: Adequate for Discharge 08/09/2022 1051 by Chesley Valls Bet, LPN Outcome: Progressing   Problem: Safety: Goal: Ability to remain free from injury will improve 08/09/2022 1536 by Isabelly Kobler Bet, LPN Outcome: Adequate for Discharge 08/09/2022 1051 by Delia Slatten Bet, LPN Outcome: Progressing   Problem: Skin Integrity: Goal: Risk for impaired skin integrity will decrease 08/09/2022 1536 by Alexandria Current Bet, LPN Outcome: Adequate for Discharge 08/09/2022 1051 by Tyasia Packard Bet, LPN Outcome: Progressing   Problem: Acute Rehab OT Goals (only OT should resolve) Goal: Pt. Will Perform Grooming Outcome: Adequate for Discharge Goal: Pt. Will Transfer To Toilet Outcome: Adequate for Discharge Goal: Pt. Will Perform Toileting-Clothing Manipulation Outcome: Adequate for Discharge   Problem: Acute Rehab PT Goals(only PT should resolve) Goal: Pt Will Go Supine/Side To Sit Outcome: Adequate for Discharge Goal: Pt Will Go Sit To Supine/Side Outcome: Adequate for Discharge Goal: Patient Will Transfer Sit To/From Stand Outcome: Adequate for Discharge Goal: Pt Will Ambulate Outcome: Adequate for Discharge   Problem: Malnutrition  (NI-5.2) Goal: Food and/or nutrient delivery Description:  Individualized approach for food/nutrient provision. Outcome: Adequate for Discharge

## 2022-08-09 NOTE — Discharge Summary (Signed)
Physician Discharge Summary   Patient: Suzanne Beltran MRN: [id number] DOB: 19-Mar-1925  Admit date:     08/05/2022  Discharge date: 08/09/22  Discharge Physician: Pennie Banter   PCP: [name]   Recommendations at discharge:   Follow up with [facility] in 2 weeks Follow up with Primary Care in 1 week Repeat CBC, BMP in 1 week  Discharge Diagnoses: Principal Problem:   Closed right hip fracture (HCC) Active Problems:   AMS (altered mental status)   Femoral fracture (HCC)   Thumb fracture   Normocytic anemia   Protein-calorie malnutrition, severe  Resolved Problems:   Leukocytosis  Hospital Course:  Suzanne Beltran is a 89 y.o. female with medical history significant of advanced dementia who presented from ALF memory care unit after a fall.   Pt barely able to walk with a walker till 1 week ago and then stopped walking.  Very limited interaction with family limited to about 1 short sentence at a time.  And requiring total care giving.  Patient apparently suffered a fall from her commode yesterday onto the floor.     Daughter recalled anotehr fall in February complicated by "shoulder fracture".  Further hospital course and management as outlined below.  08/09/22 -- Pt doing well.  Stable anemia slowing improving.  Medically stable for discharge to rehab today.   Assessment and Plan:  * Right hip fracture --Displaced comminuted intertrochanteric fracture of the right hip with severe osteopenia.  From fall. Plan: --status post right IM NAIL INTERTROCHANTERIC 4/30. --DVT prophylaxis - TED hose, Lovenox --Wt-bearing as tolerated on RLE --Follow up with [facility] in 2 weeks for staple removal --Dressing changes as needed --PT/OT at rehab   Falls Advanced dementia --Discussed with daughter goals of care for this pt, and daughter wants everything done to help pt regain function and walk again.  Given significant decline in her mobility in the setting  of advanced dementia and advanced age, it's almost certain if pt regains some mobility, pt will fall again and suffer further fracture.  Daughter is aware. --followed by hospice prior to admission --PT/OT at rehab   Thumb fracture, right Comminuted fracture of the base of the proximal phalanx of the thumb, possibly subacute or chronic. --no need for surgical intervention.   Normocytic Anemia / Iron Deficiency Acute blood loss anemia Hbg post-op 7.3, down from 8.7. Hbg was 9.9 on admission. Hbg today improving 7.1 >> 7.7   Anemia panel shows iron deficiency No evidence of active bleeding, suspect surgical blood loss --IV iron infusion given --Did not require blood transfusion --Repeat CBC in 1 week   Dysphagia -- due to Alzheimer's dementia --Pureed diet and thin liquids --Seen by SLP for swallow evaluation --Aspiration precautions   Leukocytosis - resolved Possibly from pain/fracture.  At this time no focus of infection.  --CBC in 1 week   AMS (altered mental status) Likely worsening from pain, pain meds as well as femur fracture/inflamation.  Patient has limited functional baseline with dementia.  Monitor with pain control.   AKI vs CKD 3a --Cr varied in the past, need to trend more Cr to determine AKI vs CKD.   Underweight - Body mass index is 17.68 kg/m. Dementia and dysphagia contribute. SLP for swallow evaluation. Encourage PO intake when awake & alert        Consultants: Orthopedic surgery Procedures performed: IM nail to hip frature   Disposition: Skilled nursing facility  Diet recommendation:  Dysphagia type 1 thin  Liquid  DISCHARGE MEDICATION: Allergies as of 08/09/2022   No Known Allergies      Medication List     STOP taking these medications    cetirizine 10 MG tablet Commonly known as: ZYRTEC   ferrous sulfate 325 (65 FE) MG tablet   HYDROcodone-acetaminophen 5-325 MG tablet Commonly known as: Norco   ICAPS PO    losartan-hydrochlorothiazide 50-12.5 MG tablet Commonly known as: HYZAAR   mirtazapine 15 MG tablet Commonly known as: REMERON   omeprazole 20 MG capsule Commonly known as: PRILOSEC   VITAMIN D3 GUMMIES PO       TAKE these medications    acetaminophen 500 MG tablet Commonly known as: TYLENOL Take 1,000 mg by mouth 3 (three) times daily.   amLODipine 10 MG tablet Commonly known as: NORVASC Take 10 mg by mouth daily.   atenolol 50 MG tablet Commonly known as: TENORMIN Take 50 mg by mouth daily.   Ativan 1 MG tablet Generic drug: LORazepam Take 1 mg by mouth every 8 (eight) hours as needed.   busPIRone 5 MG tablet Commonly known as: BUSPAR Take 5 mg by mouth 2 (two) times daily.   Dulcolax 10 MG suppository Generic drug: bisacodyl Place 10 mg rectally daily as needed.   enoxaparin 30 MG/0.3ML injection Commonly known as: LOVENOX Inject 0.3 mLs (30 mg total) into the skin daily for 10 days.   feeding supplement Liqd Take 237 mLs by mouth 2 (two) times daily between meals.   loperamide 2 MG capsule Commonly known as: IMODIUM Take 1 capsule (2 mg total) by mouth as needed for diarrhea or loose stools.   methocarbamol 500 MG tablet Commonly known as: ROBAXIN Take 1 tablet (500 mg total) by mouth every 6 (six) hours as needed for muscle spasms.   MiraLax 17 GM/SCOOP powder Generic drug: polyethylene glycol powder SMARTSIG:17 Gram(s) By Mouth Daily PRN   multivitamin with minerals Tabs tablet Take 1 tablet by mouth daily.   pantoprazole 40 MG tablet Commonly known as: PROTONIX Take 40 mg by mouth daily.   phenylephrine-shark liver oil-mineral oil-petrolatum 0.25-14-74.9 % rectal ointment Commonly known as: PREPARATION H Apply topically to hemorrhoids after each bowel movement up to 4 times daily.   QUEtiapine 25 MG tablet Commonly known as: SEROQUEL Take 25 mg by mouth 2 (two) times daily.   senna 8.6 MG Tabs tablet Commonly known as: SENOKOT Take  1 tablet by mouth daily.   sertraline 100 MG tablet Commonly known as: ZOLOFT Take 100 mg by mouth daily.   Systane Complete 0.6 % Soln Generic drug: Propylene Glycol Apply 1 drop to eye 2 (two) times daily.   traMADol 50 MG tablet Commonly known as: ULTRAM Take 1-2 tablets (50-100 mg total) by mouth every 6 (six) hours as needed for moderate pain or severe pain.        Follow-up Information     Dedra Skeens, PA-C Follow up in 2 week(s).   Specialty: Orthopedic Surgery Why: For staple removal of the right hip and x-rays of the right hip.  X-rays of the right thumb. Contact information: [address] [place] [address] [phone]                Discharge Exam: Ceasar Mons Weights   08/05/22 1410  Weight: 46.7 kg   General exam: sleeping comfortably, woke to voice, no acute distress HEENT: moist mucus membranes, hearing grossly normal  Respiratory system: CTAB diminished bases due to slumped positioning in chair,  no wheezes, rales or rhonchi, normal respiratory effort. Cardiovascular system: normal S1/S2, RRR, no JVD, murmurs, rubs, gallops, no pedal edema.   Gastrointestinal system: soft, NT, ND, no HSM felt, +bowel sounds. Central nervous system: A&O x  self. no gross focal neurologic deficits, normal speech Extremities: no edema, normal tone Skin: dry, intact, normal temperature Psychiatry: normal mood, congruent affect, judgement and insight appear normal   Condition at discharge: stable  The results of significant diagnostics from this hospitalization (including imaging, microbiology, ancillary and laboratory) are listed below for reference.   Imaging Studies: DG HIP UNILAT WITH PELVIS 2-3 VIEWS RIGHT  Result Date: 08/06/2022 CLINICAL DATA:  Right intramedullary nail fixation EXAM: DG HIP (WITH OR WITHOUT PELVIS) 3V RIGHT COMPARISON:  08/05/2022 FINDINGS: Four fluoroscopic images are obtained during the performance of the  procedure and are provided for interpretation only. Images demonstrate intramedullary nail placement for a right intertrochanteric femur fracture. Fluoroscopy time: 54 seconds Cumulative air kerma: 6.04 mGy IMPRESSION: Intramedullary nail fixation of a right femur fracture. Electronically Signed   By: Wiliam Ke M.D.   On: 08/06/2022 14:05   DG C-Arm 1-60 Min-No Report  Result Date: 08/06/2022 Fluoroscopy was utilized by the requesting physician.  No radiographic interpretation.   DG Hand Complete Right  Result Date: 08/05/2022 CLINICAL DATA:  Fall EXAM: RIGHT HAND - COMPLETE 3 VIEW COMPARISON:  None Available. FINDINGS: Comminuted fracture of the base of the proximal phalanx of the thumb, possibly subacute or chronic. No evidence of dislocation. Advanced degenerative changes of the thumb CMC and MCP joints. Scattered moderate degenerative changes of the IP joints. Diffuse demineralization. Comminuted fracture of the base of the proximal phalanx of the thumb. Additional chronic appearing osseous deformities of the proximal fourth and fifth metacarpals. Soft tissues are unremarkable. IMPRESSION: 1. Comminuted fracture of the base of the proximal phalanx of the thumb, possibly subacute or chronic. Correlate for point tenderness. 2. Additional chronic appearing osseous deformities of the proximal fourth and fifth metacarpals. 3. Advanced degenerative changes of the thumb CMC and MCP joints. Electronically Signed   By: Allegra Lai M.D.   On: 08/05/2022 16:43   DG Chest 1 View  Result Date: 08/05/2022 CLINICAL DATA:  Pain after fall EXAM: CHEST  1 VIEW COMPARISON:  Left shoulder x-ray 05/21/2022.  Chest x-ray 05/24/2021 FINDINGS: Film is rotated to the left. Stable cardiopericardial silhouette with a calcified aorta. Large hiatal hernia. No consolidation, pneumothorax, effusion or edema. Mild left basilar atelectasis. Known comminuted proximal humeral fracture on the left. Overlapping cardiac leads  IMPRESSION: Hyperinflation.  Mild left basilar atelectasis. Rotated radiograph with enlarged heart.  Calcified aorta. Hiatal hernia. Electronically Signed   By: Karen Kays M.D.   On: 08/05/2022 16:40   DG Hip Unilat  With Pelvis 2-3 Views Right  Result Date: 08/05/2022 CLINICAL DATA:  Pain after fall EXAM: DG HIP (WITH OR WITHOUT PELVIS) 3V RIGHT COMPARISON:  X-ray 08/23/2020 FINDINGS: Osteopenia. Comminuted fracture of the right hip at the intertrochanteric region. Some angulation and displacement. There is deformity of the pubic bones bilaterally. The along the inferior pubic ramus on the right is stable. Left are new from the study of 2022. Please correlate for any known history. Global degenerative changes with osteophyte formation joint space loss particularly of the left hip. Scattered colonic stool. Degenerative changes of the sacroiliac joints. IMPRESSION: Displaced comminuted intertrochanteric fracture of the right hip with severe osteopenia. More chronic appearing deformity of the pubic bones. Those on the left were not seen  on the study of 2022. Please correlate with history. Scattered degenerative changes seen particularly of the left hip Electronically Signed   By: Karen Kays M.D.   On: 08/05/2022 16:38   CT Cervical Spine Wo Contrast  Result Date: 08/05/2022 CLINICAL DATA:  Witnessed fall yesterday EXAM: CT CERVICAL SPINE WITHOUT CONTRAST TECHNIQUE: Multidetector CT imaging of the cervical spine was performed without intravenous contrast. Multiplanar CT image reconstructions were also generated. RADIATION DOSE REDUCTION: This exam was performed according to the departmental dose-optimization program which includes automated exposure control, adjustment of the mA and/or kV according to patient size and/or use of iterative reconstruction technique. COMPARISON:  05/21/2022 FINDINGS: Alignment: Alignment is anatomic. Skull base and vertebrae: No acute fracture. No primary bone lesion or focal  pathologic process. Soft tissues and spinal canal: No prevertebral fluid or swelling. No visible canal hematoma. Disc levels: Mild diffuse cervical spondylosis most pronounced at C3-4, C4-5, and C5-6. No change since prior exam. Upper chest: Airway is patent. Stable 3 mm left apical nodule. No specific imaging follow-up is recommended if the patient is low risk. Other: Reconstructed images demonstrate no additional findings. IMPRESSION: 1. Stable cervical spine.  No acute fracture. Electronically Signed   By: Sharlet Salina M.D.   On: 08/05/2022 16:23   CT Head Wo Contrast  Result Date: 08/05/2022 CLINICAL DATA:  Witnessed fall yesterday EXAM: CT HEAD WITHOUT CONTRAST TECHNIQUE: Contiguous axial images were obtained from the base of the skull through the vertex without intravenous contrast. RADIATION DOSE REDUCTION: This exam was performed according to the departmental dose-optimization program which includes automated exposure control, adjustment of the mA and/or kV according to patient size and/or use of iterative reconstruction technique. COMPARISON:  05/21/2022 FINDINGS: Brain: Stable chronic small-vessel ischemic changes throughout the periventricular white matter. No evidence of acute infarct or hemorrhage. Lateral ventricles and midline structures are unremarkable. No acute extra-axial fluid collections. No mass effect. Vascular: Stable atherosclerosis.  No hyperdense vessel. Skull: Normal. Negative for fracture or focal lesion. Sinuses/Orbits: No acute finding. Other: None. IMPRESSION: 1. Stable head CT, no acute intracranial process. Electronically Signed   By: Sharlet Salina M.D.   On: 08/05/2022 16:21    Microbiology: Results for orders placed or performed during the hospital encounter of 08/05/22  Culture, blood (Routine X 2) w Reflex to ID Panel     Status: None (Preliminary result)   Collection Time: 08/05/22  9:29 PM   Specimen: BLOOD  Result Value Ref Range Status   Specimen Description  BLOOD BLOOD RIGHT ARM  Final   Special Requests   Final    BOTTLES DRAWN AEROBIC AND ANAEROBIC Blood Culture adequate volume   Culture   Final    NO GROWTH 4 DAYS Performed at [facility], [address], [place], [place] [address]    Report Status PENDING  Incomplete  Culture, blood (Routine X 2) w Reflex to ID Panel     Status: None (Preliminary result)   Collection Time: 08/05/22  9:29 PM   Specimen: BLOOD  Result Value Ref Range Status   Specimen Description BLOOD RIGHT ANTECUBITAL  Final   Special Requests   Final    BOTTLES DRAWN AEROBIC AND ANAEROBIC Blood Culture adequate volume   Culture   Final    NO GROWTH 4 DAYS Performed at [facility], [address], [place], [place] [address]    Report Status PENDING  Incomplete  Surgical PCR screen     Status: None   Collection Time:  08/06/22  4:18 AM   Specimen: Nasal Mucosa; Nasal Swab  Result Value Ref Range Status   MRSA, PCR NEGATIVE NEGATIVE Final   Staphylococcus aureus NEGATIVE NEGATIVE Final    Comment: (NOTE) The Xpert SA Assay (FDA approved for NASAL specimens in patients 40 years of age and older), is one component of a comprehensive surveillance program. It is not intended to diagnose infection nor to guide or monitor treatment. Performed at [facility], [address] [place], [place] [address]     Labs: CBC: Recent Labs  Lab 08/05/22 1425 08/05/22 2322 08/06/22 1058 08/07/22 0637 08/07/22 1533 08/08/22 0459 08/09/22 0301 08/09/22 1314  WBC 14.9* 13.0* 13.1* 15.2*  --  10.1 8.1  --   NEUTROABS 11.4*  --   --   --   --   --   --   --   HGB 9.9* 8.7* 8.7* 7.3* 7.3* 7.6* 7.1* 7.7*  HCT 30.8* 26.9* 27.1* 23.1* 22.9* 23.2* 22.4* 23.9*  MCV 89.8 89.1 88.3 90.6  --  91.0 90.0  --   PLT 468* 424* 417* 394  --  430* 442*  --    Basic Metabolic Panel: Recent Labs  Lab 08/05/22 1425 08/06/22 1058 08/07/22 0637 08/08/22 0459 08/09/22 0301  NA 141 140 144 140 140  K  3.6 3.7 4.7 4.0 3.7  CL 112* 113* 118* 115* 115*  CO2 18* 20* 20* 20* 21*  GLUCOSE 123* 130* 107* 91 97  BUN 41* 41* 43* 38* 33*  CREATININE 1.41* 1.34* 1.42* 1.23* 1.06*  CALCIUM 8.6* 8.6* 8.2* 8.1* 8.0*  MG  --   --  2.0  --   --    Liver Function Tests: No results for input(s): "AST", "ALT", "ALKPHOS", "BILITOT", "PROT", "ALBUMIN" in the last 168 hours. CBG: No results for input(s): "GLUCAP" in the last 168 hours.  Discharge time spent: greater than 30 minutes.  Signed: Pennie Banter, DO [facility] 08/09/2022

## 2022-08-09 NOTE — Progress Notes (Signed)
Physical Therapy Treatment Patient Details Name: Suzanne Beltran MRN: [id number] DOB: [birth year] Today's Date: 08/09/2022   History of Present Illness 89 y/o F with PMH including dementia, depcression, CKD3, HTN, shoulder fx. Here s/p fall with R femur fx and R thumb fx; R hip ORIF 4/30.    PT Comments    Pt was asleep in long sitting however easily awakes. She remained alert but presents with flat affect. Only oriented to self. Hgb 7.1 noted. Pt did endorse feeling tired but was able to fully participate in session. She requires increased time to perform all desired task. Pt had three loose Bms during session with every attempt upon standing. RN made aware. She was able to tolerated getting OOB and ambulating ~ 15 ft with RW + slow antalgic step to gait pattern. Chair follow for additional safety. DC recs remain appropriate. Acute PT will continue to follow per current POC.    Recommendations for follow up therapy are one component of a multi-disciplinary discharge planning process, led by the attending physician.  Recommendations may be updated based on patient status, additional functional criteria and insurance authorization.  Assistance Recommended at Discharge Frequent or constant Supervision/Assistance  Patient can return home with the following A lot of help with walking and/or transfers;A lot of help with bathing/dressing/bathroom;Assistance with cooking/housework;Assistance with feeding;Direct supervision/assist for medications management;Direct supervision/assist for financial management;Assist for transportation;Help with stairs or ramp for entrance   Equipment Recommendations  Other (comment) (Defer to next level of care)       Precautions / Restrictions Precautions Precautions: Fall Restrictions Weight Bearing Restrictions: Yes RLE Weight Bearing: Weight bearing as tolerated     Mobility  Bed Mobility Overal bed mobility: Needs Assistance Bed Mobility: Supine to Sit  Sit  to supine: Mod assist General bed mobility comments: increased time to perform with step by step vcing for technique and sequencing. Mod assist to fully achieve EOB short sit    Transfers Overall transfer level: Needs assistance Equipment used: Rolling walker (2 wheels) Transfers: Sit to/from Stand Sit to Stand: Min assist  General transfer comment: pt performed STS 5 x total due to pt have several loose BMs upon staning. Total assist for hygiene care in standing.    Ambulation/Gait Ambulation/Gait assistance: Min assist, Mod assist Gait Distance (Feet): 15 Feet Assistive device: Rolling walker (2 wheels) Gait Pattern/deviations: Step-to pattern, Antalgic Gait velocity: decreased  General Gait Details: pt was able to tolerate gait ~ 15 ft with chair follow for safety. slow antalgic step to gait pattern. Pt required alot of vcs/encouragement for desired task requested of her.   Balance Overall balance assessment: Needs assistance Sitting-balance support: Feet supported Sitting balance-Leahy Scale: Fair     Standing balance support: Reliant on assistive device for balance, During functional activity, Bilateral upper extremity supported Standing balance-Leahy Scale: Poor Standing balance comment: high fall risk with constant assistance during dynamic standing activity to prevent falling       Cognition Arousal/Alertness: Awake/alert Behavior During Therapy: Flat affect Overall Cognitive Status: History of cognitive impairments - at baseline    General Comments: Pt is alert and able to follow simple commands. only oriented to self               Pertinent Vitals/Pain Pain Assessment Pain Assessment: PAINAD Breathing: normal Negative Vocalization: occasional moan/groan, low speech, negative/disapproving quality Facial Expression: smiling or inexpressive Body Language: relaxed Consolability: no need to console PAINAD Score: 1 Pain Location: unable to rate, did have  guarding but  reports "not too bad" when asked specifically about hip pain     PT Goals (current goals can now be found in the care plan section) Acute Rehab PT Goals Patient Stated Goal: none stated Progress towards PT goals: Progressing toward goals    Frequency    7X/week      PT Plan Current plan remains appropriate       AM-PAC PT "6 Clicks" Mobility   Outcome Measure  Help needed turning from your back to your side while in a flat bed without using bedrails?: A Little Help needed moving from lying on your back to sitting on the side of a flat bed without using bedrails?: A Lot Help needed moving to and from a bed to a chair (including a wheelchair)?: A Lot Help needed standing up from a chair using your arms (e.g., wheelchair or bedside chair)?: A Lot Help needed to walk in hospital room?: A Lot Help needed climbing 3-5 steps with a railing? : A Lot 6 Click Score: 13    End of Session Equipment Utilized During Treatment: Gait belt Activity Tolerance: Patient tolerated treatment well Patient left: in chair;with call bell/phone within reach;with chair alarm set Nurse Communication: Mobility status PT Visit Diagnosis: Muscle weakness (generalized) (M62.81);Difficulty in walking, not elsewhere classified (R26.2);Pain Pain - Right/Left: Right Pain - part of body: Hip     Time: 0806-0829 PT Time Calculation (min) (ACUTE ONLY): 23 min  Charges:  $Gait Training: 8-22 mins $Therapeutic Activity: 8-22 mins                    Jetta Lout PTA 08/09/22, 8:53 AM

## 2022-08-09 NOTE — TOC Progression Note (Addendum)
Transition of Care [facility]) - Progression Note    Patient Details  Name: Suzanne Beltran MRN: [id number] Date of Birth: May 26, 1924  Transition of [facility] CM/SW Contact  Marlowe Sax, RN Phone Number: 08/09/2022, 11:28 AM  Clinical Narrative:   uploaded the clinical documents to [place] MUST PASSR Pending Spoke with the patient's daughter and Billee Cashing, we reviewed the bed offers and Medicare Star rating, she will review with her family and call me back to provide the bed choice, I explained that she would have to be willing and Able to participate with Therapy to continue to get medicare Approval to stay in STR, she will return to Memory care once she is out of STR Anticipate cll soon with a bed choice   Received a call from [facility], they chose the Bed offer from [facility], I notified Gina at [facility] Received PASSR number [phone] [id number]   Expected Discharge Plan: Skilled Nursing Facility Barriers to Discharge: Awaiting State Approval [facility] Roberts), SNF Pending bed offer  Expected Discharge Plan and Services   Discharge Planning Services: CM Consult   Living arrangements for the past 2 months: Assisted Living Facility                 DME Arranged: N/A DME Agency: NA       HH Arranged: NA           Social Determinants of Health (SDOH) Interventions SDOH Screenings   Transportation Needs: Patient Unable To Answer (08/05/2022)  Utilities: Patient Unable To Answer (08/05/2022)  Tobacco Use: Low Risk  (08/07/2022)    Readmission Risk Interventions     No data to display

## 2022-08-09 NOTE — Progress Notes (Signed)
  Subjective: 3 Days Post-Op Procedure(s) (LRB): INTRAMEDULLARY (IM) NAIL INTERTROCHANTERIC (Right) Patient reports pain as mild.  Less confused this morning.  No complaints Patient is well, and has had no acute complaints or problems Plan is to go Skilled nursing facility after hospital stay. Negative for chest pain and shortness of breath Fever: no Gastrointestinal: Negative for nausea and vomiting  Objective: Vital signs in last 24 hours: Temp:  [97.5 F (36.4 C)-97.9 F (36.6 C)] 97.9 F (36.6 C) (05/02 2319) Pulse Rate:  [70-74] 73 (05/02 2319) Resp:  [15-20] 20 (05/02 2319) BP: (115-138)/(60-66) 115/60 (05/02 2319) SpO2:  [96 %-99 %] 97 % (05/02 2319)  Intake/Output from previous day:  Intake/Output Summary (Last 24 hours) at 08/09/2022 0629 Last data filed at 08/08/2022 1520 Gross per 24 hour  Intake 265 ml  Output --  Net 265 ml    Intake/Output this shift: No intake/output data recorded.  Labs: Recent Labs    08/06/22 1058 08/07/22 0637 08/07/22 1533 08/08/22 0459 08/09/22 0301  HGB 8.7* 7.3* 7.3* 7.6* 7.1*   Recent Labs    08/08/22 0459 08/09/22 0301  WBC 10.1 8.1  RBC 2.55*  2.60* 2.49*  HCT 23.2* 22.4*  PLT 430* 442*   Recent Labs    08/08/22 0459 08/09/22 0301  NA 140 140  K 4.0 3.7  CL 115* 115*  CO2 20* 21*  BUN 38* 33*  CREATININE 1.23* 1.06*  GLUCOSE 91 97  CALCIUM 8.1* 8.0*   Recent Labs    08/06/22 1058  INR 1.3*     EXAM General - Patient is Alert and Confused Extremity - Sensation intact distally Dorsiflexion/Plantar flexion intact Compartment soft Dressing/Incision - clean, dry, no drainage Motor Function - intact, moving foot and toes well on exam.  Ambulated 12 feet with physical therapy  Past Medical History:  Diagnosis Date   CKD (chronic kidney disease)    Dementia (HCC)    Depression    GERD (gastroesophageal reflux disease)    Hypertension    Iron deficiency anemia    Metabolic encephalopathy      Assessment/Plan: 3 Days Post-Op Procedure(s) (LRB): INTRAMEDULLARY (IM) NAIL INTERTROCHANTERIC (Right) Principal Problem:   Closed right hip fracture (HCC) Active Problems:   AMS (altered mental status)   Femoral fracture (HCC)   Leukocytosis   Thumb fracture   Normocytic anemia   Protein-calorie malnutrition, severe  Estimated body mass index is 17.68 kg/m as calculated from the following:   Height as of this encounter: 5\' 4"  (1.626 m).   Weight as of this encounter: 46.7 kg. Advance diet Up with therapy  Discharge planning: Plan to follow-up at [facility] clinic orthopedics in 2 weeks for staple removal.  Dressing change if needed.  Acute blood loss anemia status post hip fracture.  Hemoglobin 7.1 today.   Hospitalist to decide about transfusion.  DVT Prophylaxis - TED hose and heparin Weight-Bearing as tolerated to right leg Limit use of the right hand secondary to thumb fracture, but because of confusion, she is using this normally.  Dedra Skeens, PA-C Orthopaedic Surgery 08/09/2022, 6:29 AM

## 2022-08-10 LAB — CULTURE, BLOOD (ROUTINE X 2): Culture: NO GROWTH

## 2022-08-22 ENCOUNTER — Non-Acute Institutional Stay: Payer: Medicare Other | Admitting: Nurse Practitioner

## 2022-08-22 DIAGNOSIS — F0280 Dementia in other diseases classified elsewhere without behavioral disturbance: Secondary | ICD-10-CM

## 2022-08-22 DIAGNOSIS — R52 Pain, unspecified: Secondary | ICD-10-CM

## 2022-08-22 DIAGNOSIS — Z515 Encounter for palliative care: Secondary | ICD-10-CM

## 2022-08-22 NOTE — Progress Notes (Addendum)
Therapist, nutritional Palliative Care Consult Note Telephone: [phone]  Fax: [phone]   Date of encounter: 08/22/22 8:49 PM PATIENT NAME: Suzanne Beltran [address] [place] [address]   [phone] (home)  DOB: 1924-12-03 MRN: [id number] PRIMARY CARE PROVIDER:   [name],  [address] [place] [address] [phone]  RESPONSIBLE PARTY:    Contact Information     Name Relation Home Work [place] Daughter [phone]  [phone]   [name]  [phone]  [phone]   [name],[name]   [phone]      I met face to face with patient and family in facility. Palliative Care was asked to follow this patient by consultation request of  [name] to address advance care planning and complex medical decision making. This is the initial visit.         ASSESSMENT AND PLAN / RECOMMENDATIONS:  Symptom Management/Plan: 1. Advance Care Planning;   2. Goals of Care: Goals include to maximize quality of life and symptom management. Our advance care planning conversation included a discussion about:    The value and importance of advance care planning  Exploration of personal, cultural or spiritual beliefs that might influence medical decisions  Exploration of goals of care in the event of a sudden injury or illness  Identification and preparation of a healthcare agent  Review and updating or creation of an advance directive document. 3. Palliative care encounter; Palliative care encounter; Palliative medicine team will continue to support patient, patient's family, and medical team. Visit consisted of counseling and education dealing with the complex and emotionally intense issues of symptom management and palliative care in the setting of serious and potentially life-threatening illness  4. Pain secondary to right hip fracture;  reviewed pain on pain scale, current pain regimen, comfortable, will continue. Encourage increase mobility with PT/OT though limited with cognitive impairment, age, co-morbidities and overall decline as she previously was under hospice services and wishes are to return to hospice once therapy is completed.  5. Dementia with protein calorie malnutrition ongoing, reviewed weights, appetite, disease progression of dementia. We talked about with both dtg's . We talked about possibility of transition to LTC as maybe not able to return to [facility] locked memory care unit with functional decline even with hospice. We talked about Suzanne Beltran taking her home with hospice at home, caregivers. We talked about realistic expectations, overall concerns. Support provided.  Follow up Palliative Care Visit: Palliative care will continue to follow for complex medical decision making, advance care planning, and clarification of goals. Return 2 to 8 weeks or prn.  I spent 62 minutes providing this consultation. More than 50% of the time in this consultation was spent in counseling and care coordination.  PPS: 40%  Chief Complaint: Initial palliative consult for complex medical decision making, address goals, manage ongoing symptoms  HISTORY OF PRESENT ILLNESS:  Suzanne Beltran is a 89 y.o. year old female  with multiple medical problems including dementia, anemia, protein calorie malnutrition. Hospitalized from 08/05/2022 to 08/09/2022 for hip fracture. Suzanne Beltran was previously residing at [facility] ALF Locked Memory care unit with hospice services requiring assistance for mobility, transfers, adl's including bathing, dressing, tray set up, able to feed herself with decreased appetite. Suzanne Beltran resulted in hip fracture, then sent to [facility] where she currently resides for STR. Suzanne Beltran functional abilities has continued to decrease, limitations  with PT/OT and cognitive decline. Decreased appetite,  decreased functional and cognitive ability. At present Suzanne Beltran is sitting in the chair in her room with daughter Suzanne Beltran present. We talked about how she has been feeling, ros, functional and cognitive ability though limited, speech therapist came to see if she would be able to eat more solid foods. Suzanne Beltran and I stepped out, called Suzanne Beltran dtg for conference call as Suzanne Beltran is HCPOA. Clinical update discussed. We talked about the last time Suzanne Beltran was independent, course of events that lead to [facility] with hospice. We talked about hospitalization, current STR and realistic expectations. We talked about options with cognitive and functional decline including LTC with re-initiating hospice, ALF with hospice, home with hospice. We talked about disease progression and realistic expectations of therapy as Suzanne Beltran with dementia would not be able to retain exercises, therapy goals. We talked about pc visit, ros, poc, and hospice at length. Discussed will see how she does with therapy and when d/c comes up revisit options, goc. Medications reviewed. Therapeutic listening, emotional support provided. Questions answered. Contact information provided   History obtained from review of EMR, discussion with primary team, and interview with family, facility staff/caregiver and/or Suzanne. Beltran.  I reviewed available labs, medications, imaging, studies and related documents from the EMR.  Records reviewed and summarized above.   Physical Exam: General: frail appearing, thin, pleasantly confused female ENMT: oral mucous membranes moist CV: S1S2, RRR, no LE edema Pulmonary: Breath sounds clear Abdomen: normo-active BS + 4 quadrants, soft and non tender MSK: ambulatory Skin: warm and dry Neuro:  + generalized weakness,  + cognitive impairment Psych: non-anxious affect, Alert, oriented to self CURRENT PROBLEM LIST:  Patient Active Problem List   Diagnosis Date Noted   Protein-calorie malnutrition, severe  08/08/2022   Normocytic anemia 08/07/2022   Closed right hip fracture (HCC) 08/05/2022   Femoral fracture (HCC) 08/05/2022   Thumb fracture 08/05/2022   Screening examination for pulmonary tuberculosis    AMS (altered mental status)    Hypertensive encephalopathy    Hypertensive urgency 05/19/2021   Acute metabolic encephalopathy 05/19/2021   GERD (gastroesophageal reflux disease) 05/19/2021   Depression 05/19/2021   Iron deficiency anemia 05/19/2021   Dementia (HCC) 05/19/2021   Chronic kidney disease, stage 3a (HCC) 05/19/2021   Memory deficit 05/19/2021   HLD (hyperlipidemia) 05/19/2021   HTN (hypertension)    PAST MEDICAL HISTORY:  Active Ambulatory Problems    Diagnosis Date Noted   Hypertensive urgency 05/19/2021   Acute metabolic encephalopathy 05/19/2021   GERD (gastroesophageal reflux disease) 05/19/2021   Depression 05/19/2021   Iron deficiency anemia 05/19/2021   Dementia (HCC) 05/19/2021   Chronic kidney disease, stage 3a (HCC) 05/19/2021   HTN (hypertension)    Memory deficit 05/19/2021   HLD (hyperlipidemia) 05/19/2021   AMS (altered mental status)    Hypertensive encephalopathy    Screening examination for pulmonary tuberculosis    Closed right hip fracture (HCC) 08/05/2022   Femoral fracture (HCC) 08/05/2022   Thumb fracture 08/05/2022   Normocytic anemia 08/07/2022   Protein-calorie malnutrition, severe 08/08/2022   Resolved Ambulatory Problems    Diagnosis Date Noted   Leukocytosis 08/05/2022   Past Medical History:  Diagnosis Date   CKD (chronic kidney disease)    Hypertension    Metabolic encephalopathy    SOCIAL HX:  Social History   Tobacco Use   Smoking status: Never   Smokeless tobacco: Never  Substance Use Topics   Alcohol use: Never  FAMILY HX: No family history on file.    ALLERGIES: No Known Allergies   PERTINENT MEDICATIONS:  Outpatient Encounter Medications as of 08/22/2022  Medication Sig   acetaminophen (TYLENOL) 500 MG  tablet Take 1,000 mg by mouth 3 (three) times daily.   amLODipine (NORVASC) 10 MG tablet Take 10 mg by mouth daily.   atenolol (TENORMIN) 50 MG tablet Take 50 mg by mouth daily.   ATIVAN 1 MG tablet Take 1 mg by mouth every 8 (eight) hours as needed.   bisacodyl (DULCOLAX) 10 MG suppository Place 10 mg rectally daily as needed.   busPIRone (BUSPAR) 5 MG tablet Take 5 mg by mouth 2 (two) times daily.   enoxaparin (LOVENOX) 30 MG/0.3ML injection Inject 0.3 mLs (30 mg total) into the skin daily for 10 days.   feeding supplement (ENSURE ENLIVE / ENSURE PLUS) LIQD Take 237 mLs by mouth 2 (two) times daily between meals.   loperamide (IMODIUM) 2 MG capsule Take 1 capsule (2 mg total) by mouth as needed for diarrhea or loose stools.   methocarbamol (ROBAXIN) 500 MG tablet Take 1 tablet (500 mg total) by mouth every 6 (six) hours as needed for muscle spasms.   MIRALAX 17 GM/SCOOP powder SMARTSIG:17 Gram(s) By Mouth Daily PRN   Multiple Vitamin (MULTIVITAMIN WITH MINERALS) TABS tablet Take 1 tablet by mouth daily.   pantoprazole (PROTONIX) 40 MG tablet Take 40 mg by mouth daily.   phenylephrine-shark liver oil-mineral oil-petrolatum (PREPARATION H) 0.25-14-74.9 % rectal ointment Apply topically to hemorrhoids after each bowel movement up to 4 times daily.   QUEtiapine (SEROQUEL) 25 MG tablet Take 25 mg by mouth 2 (two) times daily.   senna (SENOKOT) 8.6 MG TABS tablet Take 1 tablet by mouth daily.   sertraline (ZOLOFT) 100 MG tablet Take 100 mg by mouth daily.   SYSTANE COMPLETE 0.6 % SOLN Apply 1 drop to eye 2 (two) times daily.   traMADol (ULTRAM) 50 MG tablet Take 1-2 tablets (50-100 mg total) by mouth every 6 (six) hours as needed for moderate pain or severe pain.   No facility-administered encounter medications on file as of 08/22/2022.   Thank you for the opportunity to participate in the care of Suzanne. Beltran.  The palliative care team will continue to follow. Please call our office at [phone]  if we can be of additional assistance.   Arda Keadle Z Cyniah Gossard, NP ,

## 2022-08-26 ENCOUNTER — Non-Acute Institutional Stay: Payer: Medicare Other | Admitting: Nurse Practitioner

## 2022-08-26 DIAGNOSIS — Z515 Encounter for palliative care: Secondary | ICD-10-CM

## 2022-08-26 DIAGNOSIS — F0280 Dementia in other diseases classified elsewhere without behavioral disturbance: Secondary | ICD-10-CM

## 2022-08-26 DIAGNOSIS — R52 Pain, unspecified: Secondary | ICD-10-CM

## 2022-08-26 NOTE — Progress Notes (Signed)
Therapist, nutritional Palliative Care Consult Note Telephone: [phone]  Fax: [phone]    Date of encounter: 08/26/22 9:20 PM PATIENT NAME: Suzanne Beltran [address] [place] [address]   [phone] (home)  DOB: 01-Jul-1924 MRN: [id number] PRIMARY CARE PROVIDER:   [name],  [address] [name] [phone]  RESPONSIBLE PARTY:    Contact Information     Name Relation Home Work [place] Daughter [phone]  [phone]   [name]  [phone]  [phone]   [name],[name]   [phone]     I met face to face with patient and family in facility. Palliative Care was asked to follow this patient by consultation request of  [name] to address advance care planning and complex medical decision making. This is the initial visit.         ASSESSMENT AND PLAN / RECOMMENDATIONS:  Symptom Management/Plan: 1. Advance Care Planning;   2. Goals of Care: Goals include to maximize quality of life and symptom management. Our advance care planning conversation included a discussion about:    The value and importance of advance care planning  Exploration of personal, cultural or spiritual beliefs that might influence medical decisions  Exploration of goals of care in the event of a sudden injury or illness  Identification and preparation of a healthcare agent  Review and updating or creation of an advance directive document. 3. Palliative care encounter; Palliative care encounter; Palliative medicine team will continue to support patient, patient's family, and medical team. Visit consisted of counseling and education dealing with the complex and emotionally intense issues of symptom management and palliative care in the setting of serious and potentially life-threatening illness   4. Pain secondary to right hip fracture; reviewed  pain on pain scale, current pain regimen, comfortable, will continue. Encourage increase mobility with PT/OT though limited with cognitive impairment, age, co-morbidities and overall decline as she previously was under hospice services and wishes are to return to hospice once therapy is completed.  5. Dementia with protein calorie malnutrition ongoing, reviewed weights, appetite, disease progression of dementia.  Follow up Palliative Care Visit: Palliative care will continue to follow for complex medical decision making, advance care planning, and clarification of goals. Return 2 to 8 weeks or prn.   I spent 45 minutes providing this consultation. More than 50% of the time in this consultation was spent in counseling and care coordination.   PPS: 40%   Chief Complaint: Initial palliative consult for complex medical decision making, address goals, manage ongoing symptoms   HISTORY OF PRESENT ILLNESS:  Suzanne Beltran is a 89 y.o. year old female  with multiple medical problems including dementia, anemia, protein calorie malnutrition. Hospitalized from 08/05/2022 to 08/09/2022 for hip fracture. Suzanne Beltran was previously residing at [facility] ALF Locked Memory care unit with hospice services requiring assistance for mobility, transfers, adl's including bathing, dressing, tray set up, able to feed herself with decreased appetite. Suzanne Beltran resulted in hip fracture, then sent to [facility] where she currently resides for STR. Suzanne Beltran functional abilities has continued to decrease, limitations with PT/OT and cognitive decline. Decreased appetite, decreased functional and cognitive ability. At present Suzanne Beltran is sitting in the chair in her room with daughter Suzanne Beltran.   Medications reviewed. Therapeutic listening, emotional support provided. Questions answered. Contact information provided    History obtained from review of EMR, discussion  with primary team, and interview with family, facility  staff/caregiver and/or Suzanne Beltran.  I reviewed available labs, medications, imaging, studies and related documents from the EMR.  Records reviewed and summarized above.    Physical Exam: General: frail appearing, thin, pleasantly confused female ENMT: oral mucous membranes moist CV: S1S2, RRR, no LE edema Pulmonary: Breath sounds clear Neuro:  + generalized weakness,  + cognitive impairment Psych: non-anxious affect, Alert, oriented to self  Thank you for the opportunity to participate in the care of Suzanne Beltran. Please call our office at [phone] if we can be of additional assistance.   Tinisha Etzkorn Prince [place], NP

## 2022-08-29 ENCOUNTER — Non-Acute Institutional Stay: Payer: Medicare Other | Admitting: Nurse Practitioner

## 2022-08-29 DIAGNOSIS — R52 Pain, unspecified: Secondary | ICD-10-CM

## 2022-08-29 DIAGNOSIS — F0280 Dementia in other diseases classified elsewhere without behavioral disturbance: Secondary | ICD-10-CM

## 2022-08-29 DIAGNOSIS — R630 Anorexia: Secondary | ICD-10-CM

## 2022-08-29 DIAGNOSIS — Z515 Encounter for palliative care: Secondary | ICD-10-CM

## 2022-08-29 NOTE — Progress Notes (Addendum)
Therapist, nutritional Palliative Care Consult Note Telephone: [phone]  Fax: [phone]    Date of encounter: 08/29/22 9:38 PM PATIENT NAME: Suzanne Beltran [address] [place] [address]   [phone] (home)  DOB: Feb 24, 1925 MRN: [id number] PRIMARY CARE PROVIDER:   [name],  [address] [place] [address] [phone]  RESPONSIBLE PARTY:    Contact Information     Name Relation Home Work [place] Daughter [phone]  [phone]   Suzanne Beltran  [phone]  [phone]   Suzanne Beltran,Suzanne Beltran Relative   [phone]     I met face to face with patient and family in facility. Palliative Care was asked to follow this patient by consultation request of  [name] to address advance care planning and complex medical decision making. This is the initial visit.         ASSESSMENT AND PLAN / RECOMMENDATIONS:  Symptom Management/Plan: 1. Advance Care Planning;  DNR; wishes are for hospice   2.  Palliative care encounter; Palliative care encounter; Palliative medicine team will continue to support patient, patient's family, and medical team. Visit consisted of counseling and education dealing with the complex and emotionally intense issues of symptom management and palliative care in the setting of serious and potentially life-threatening illness   3. Pain secondary to right hip fracture; reviewed pain on pain scale, current pain regimen, comfortable, will continue. Encourage increase mobility with PT/OT though limited with cognitive impairment, age, co-morbidities and overall decline as she previously was under hospice services and wishes are to return to hospice once therapy is completed.  4. Dementia with protein calorie malnutrition ongoing, reviewed weights, appetite, disease progression of dementia.  Follow up Palliative Care Visit: Palliative care  will continue to follow for complex medical decision making, advance care planning, and clarification of goals. Return 2 to 8 weeks or prn.   I spent 47 minutes providing this consultation. More than 50% of the time in this consultation was spent in counseling and care coordination.   PPS: 40%   Chief Complaint: Initial palliative consult for complex medical decision making, address goals, manage ongoing symptoms   HISTORY OF PRESENT ILLNESS:  Suzanne Beltran is a 89 y.o. year old female  with multiple medical problems including dementia, anemia, protein calorie malnutrition. Hospitalized from 08/05/2022 to 08/09/2022 for hip fracture. Suzanne Beltran was previously residing at [facility] ALF Locked Memory care unit with hospice services requiring assistance for mobility, transfers, adl's including bathing, dressing, tray set up, able to feed herself with decreased appetite. Suzanne Beltran resulted in hip fracture, then sent to [facility] where she currently resides for STR. Suzanne Beltran functional abilities has continued to decrease, limitations with PT/OT and cognitive decline. Decreased appetite, decreased functional and cognitive ability. At present Suzanne Beltran is lying in bed in her room with daughter Suzanne Beltran. Suzanne Beltran endorses she has already been oob today, is very tired, did not eat breakfast. Suzanne Beltran is not in distress, no meaningful discussion with cognitive impairment, cooperative with assessment. Suzanne Beltran and I stepped out to contact Suzanne Beltran, Suzanne Beltran for further discussion of poc, clinical update discussed. We talked about ongoing decline, wishes for hospice. Decreased to poor appetite, sleeping more. Suzanne Beltran endorses wishes are for hospice. We talked about currently on skilled days. Suzanne Beltran endorses Suzanne Beltran declined to take her back. Suzanne Beltran asked about the hospice home, LTC with hospice. We talked about the process of transferring  to [facility] and process will need to have [facility] send order. Encourage Suzanne Beltran  to talk to [facility] about further plan of wishes to transfer to [facility]. Support provided, questions answered. Notified Suzanne Scott, Suzanne Beltran, staff of poc.  Medications, medications, poc reviewed. Support provided. Updated staff   Medications reviewed. Therapeutic listening, emotional support provided. Questions answered. Contact information provided    History obtained from review of EMR, discussion with primary team, and interview with family, facility staff/caregiver and/or Suzanne. Yarberry.  I reviewed available labs, medications, imaging, studies and related documents from the EMR.  Records reviewed and summarized above.    Physical Exam: General: frail appearing, thin, pleasantly confused female ENMT: oral mucous membranes moist CV: S1S2, RRR, no LE edema Pulmonary: Breath sounds clear Neuro:  + generalized weakness,  + cognitive impairment Psych: non-anxious affect, Alert, oriented to self Thank you for the opportunity to participate in the care of Suzanne. Tenaglia. Please call our office at [phone] if we can be of additional assistance.   Anagha Loseke Prince [place], Suzanne Beltran

## 2022-09-03 ENCOUNTER — Non-Acute Institutional Stay: Payer: Medicare Other | Admitting: Nurse Practitioner

## 2022-09-03 DIAGNOSIS — Z515 Encounter for palliative care: Secondary | ICD-10-CM

## 2022-09-03 DIAGNOSIS — R52 Pain, unspecified: Secondary | ICD-10-CM

## 2022-09-03 DIAGNOSIS — R630 Anorexia: Secondary | ICD-10-CM

## 2022-09-03 DIAGNOSIS — F0280 Dementia in other diseases classified elsewhere without behavioral disturbance: Secondary | ICD-10-CM

## 2022-09-03 NOTE — Progress Notes (Signed)
Therapist, nutritional Palliative Care Consult Note Telephone: [phone]  Fax: [phone]    Date of encounter: 09/03/22 10:07 PM PATIENT NAME: Suzanne Beltran [address] [place] [address]   [phone] (home)  DOB: 1925/03/26 MRN: [id number] PRIMARY CARE PROVIDER:   [name],  [address] [place] [address] [phone]  RESPONSIBLE PARTY:    Contact Information     Name Relation Home Work [place] Daughter [phone]  [phone]   [name]  [phone]  [phone]   [name],[name]   [phone]        I met face to face with patient and family in facility. Palliative Care was asked to follow this patient by consultation request of  [name] to address advance care planning and complex medical decision making. This is the initial visit.         ASSESSMENT AND PLAN / RECOMMENDATIONS:  Symptom Management/Plan: 1. Advance Care Planning;  DNR; wishes are for hospice    2.  Palliative care encounter; Palliative care encounter; Palliative medicine team will continue to support patient, patient's family, and medical team. Visit consisted of counseling and education dealing with the complex and emotionally intense issues of symptom management and palliative care in the setting of serious and potentially life-threatening illness   3. Pain secondary to right hip fracture; reviewed pain on pain scale, current pain regimen, comfortable, will continue. Encourage increase mobility with PT/OT though limited with cognitive impairment, age, co-morbidities and overall decline as she previously was under hospice services and wishes are to return to hospice once therapy is completed.  4. Dementia with protein calorie malnutrition ongoing, reviewed weights, appetite, disease progression of dementia. Continue to weigh, encourage Suzanne Beltran  to eat, supplements, snacks, monitor swallowing for aspiration  Follow up Palliative Care Visit: Palliative care will continue to follow for complex medical decision making, advance care planning, and clarification of goals. Return 2 to 8 weeks or prn.   I spent 45 minutes providing this consultation. More than 50% of the time in this consultation was spent in counseling and care coordination.   PPS: 40%   Chief Complaint: Initial palliative consult for complex medical decision making, address goals, manage ongoing symptoms   HISTORY OF PRESENT ILLNESS:  Suzanne Beltran is a 89 y.o. year old female  with multiple medical problems including dementia, anemia, protein calorie malnutrition. Hospitalized from 08/05/2022 to 08/09/2022 for hip fracture. Suzanne Beltran was previously residing at [facility] ALF Locked Memory care unit with hospice services requiring assistance for mobility, transfers, adl's including bathing, dressing, tray set up, able to feed herself with decreased appetite. Suzanne Beltran resulted in hip fracture, then sent to [facility] where she currently resides for STR. Suzanne Beltran functional abilities has continued to decrease, limitations with PT/OT and cognitive decline. Decreased appetite, decreased functional and cognitive ability. At present Suzanne Beltran is sitting in the chair in her room sleeping, awoke to verbal cues. Suzanne Beltran was present. We talked about update, at this point to continue therapy until completed then transition to hospice either at ALF or SNF. Encouraged son in [place] to further discuss with [facility] option of LTC at faciltiy. Ongoing discussions of hospice, poc, medications reviewed. Suzanne Beltran appears comfortable. Updated staff. Therapeutic listening, emotional support provided. Questions answered. Contact information provided    History obtained from review of EMR, discussion with primary team,  and interview with family, facility staff/caregiver and/or Suzanne.  Beltran.  I reviewed available labs, medications, imaging, studies and related documents from the EMR.  Records reviewed and summarized above.    Physical Exam: General: frail appearing, thin, pleasantly confused female ENMT: oral mucous membranes moist CV: S1S2, RRR, no LE edema Pulmonary: Breath sounds clear Neuro:  + generalized weakness,  + cognitive impairment Psych: non-anxious affect, Alert, oriented to self Thank you for the opportunity to participate in the care of Suzanne Beltran. Please call our office at [phone] if we can be of additional assistance.   Misa Fedorko Prince [place], NP

## 2022-09-09 ENCOUNTER — Non-Acute Institutional Stay: Payer: Medicare Other | Admitting: Nurse Practitioner

## 2022-09-09 ENCOUNTER — Encounter: Payer: Self-pay | Admitting: Nurse Practitioner

## 2022-09-09 DIAGNOSIS — R630 Anorexia: Secondary | ICD-10-CM

## 2022-09-09 DIAGNOSIS — R52 Pain, unspecified: Secondary | ICD-10-CM

## 2022-09-09 DIAGNOSIS — Z515 Encounter for palliative care: Secondary | ICD-10-CM

## 2022-09-09 DIAGNOSIS — F0280 Dementia in other diseases classified elsewhere without behavioral disturbance: Secondary | ICD-10-CM

## 2022-09-09 NOTE — Progress Notes (Signed)
Therapist, nutritional Palliative Care Consult Note Telephone: [phone]  Fax: [phone]    Date of encounter: 09/09/22 5:37 PM PATIENT NAME: ASHLEYNICOLE SYLVIA [address] [place] [address]   [phone] (home)  DOB: 06-11-1924 MRN: [id number] PRIMARY CARE PROVIDER:   [name],  [address] [place] [address] [phone]  RESPONSIBLE PARTY:    Contact Information     Name Relation Home Work [place] Daughter [phone]  [phone]   [name]  [phone]  [phone]   [name],[name]   [phone]        I met face to face with patient and family in facility. Palliative Care was asked to follow this patient by consultation request of  [name] to address advance care planning and complex medical decision making. This is the initial visit.         ASSESSMENT AND PLAN / RECOMMENDATIONS:  Symptom Management/Plan: 1. Advance Care Planning;  DNR; wishes are for hospice    2.  Palliative care encounter; Palliative care encounter; Palliative medicine team will continue to support patient, patient's family, and medical team. Visit consisted of counseling and education dealing with the complex and emotionally intense issues of symptom management and palliative care in the setting of serious and potentially life-threatening illness   3. Pain secondary to right hip fracture; reviewed pain on pain scale, current pain regimen, comfortable, will continue. Encourage increase mobility with PT ending soon, OT is completed with transition to LTC and revisit hospice.   4. Dementia with protein calorie malnutrition ongoing, reviewed weights, appetite, disease progression of dementia. Continue to weigh, encourage Ms Belote to eat, supplements, snacks, monitor swallowing for aspiration  Follow up Palliative Care Visit: Palliative care will  continue to follow for complex medical decision making, advance care planning, and clarification of goals. Return 2 to 8 weeks or prn.   I spent 47 minutes providing this consultation. More than 50% of the time in this consultation was spent in counseling and care coordination.   PPS: 40%   Chief Complaint: Initial palliative consult for complex medical decision making, address goals, manage ongoing symptoms   HISTORY OF PRESENT ILLNESS:  MELBA ZOPFI is a 89 y.o. year old female  with multiple medical problems including dementia, anemia, protein calorie malnutrition. Hospitalized from 08/05/2022 to 08/09/2022 for hip fracture. Ms Brieger was previously residing at [facility] ALF Locked Memory care unit with hospice services requiring assistance for mobility, transfers, adl's including bathing, dressing, tray set up, able to feed herself with decreased appetite. Ms Tritz resulted in hip fracture, then sent to [facility] where she currently resides for STR. Ms Knopf functional abilities has continued to decrease, limitations with PT/OT and cognitive decline. Decreased appetite, decreased functional and cognitive ability. At present Ms Hathway is sitting in the chair on the patio with son'in law. We talked about ros, progress with OT (which has signed off) and PT to end within a few days. Ms Sa will transition to LTC at [facility] is the plan with hospice once STR ends this week. Plan of care, medications reviewed. We talked about appetite continues to be very poor. Talked about foods, Ms Marsicano likes, though with overall decline in the setting of chronic disease. Ms Primmer appears comfortable. Updated staff. Therapeutic listening, emotional support provided. Questions answered. Contact information provided    History obtained from review of EMR, discussion with primary team,  and interview with family, facility staff/caregiver and/or Ms. Polack.  I reviewed available labs, medications,  imaging, studies and related documents from the EMR.  Records reviewed and summarized above.    Physical Exam: General: frail appearing, thin, pleasantly confused female ENMT: oral mucous membranes moist CV: S1S2, RRR Pulmonary: Breath sounds clear Musculo: +muscle wasting/atrophy Neuro:  + generalized weakness,  + cognitive impairment Psych: non-anxious affect, Alert, oriented to self Thank you for the opportunity to participate in the care of Ms. Searcy. Please call our office at [phone] if we can be of additional assistance.   Anayiah Howden Prince [place], NP

## 2022-11-07 DEATH — deceased
